# Patient Record
Sex: Female | Born: 1950 | Race: White | Hispanic: No | Marital: Married | Smoking: Never smoker
Health system: Southern US, Community
[De-identification: ages and names within clinical notes are randomized; demographics above are authoritative.]

## PROBLEM LIST (undated history)

## (undated) HISTORY — PX: TUBAL LIGATION: SHX77

## (undated) HISTORY — PX: CHOLECYSTECTOMY: SHX55

## (undated) HISTORY — PX: KNEE ARTHROSCOPY: SUR90

---

## 2013-11-15 ENCOUNTER — Emergency Department (HOSPITAL_COMMUNITY)
Admission: EM | Admit: 2013-11-15 | Discharge: 2013-11-15 | Disposition: A | Discharge status: Home / Self Care | Payer: BC Managed Care – PPO | Attending: Emergency Medicine | Admitting: Emergency Medicine

## 2013-11-15 ENCOUNTER — Emergency Department (HOSPITAL_COMMUNITY): Payer: BC Managed Care – PPO

## 2013-11-15 ENCOUNTER — Encounter (HOSPITAL_COMMUNITY): Payer: Self-pay | Admitting: Emergency Medicine

## 2013-11-15 DIAGNOSIS — R109 Unspecified abdominal pain: Secondary | ICD-10-CM

## 2013-11-15 DIAGNOSIS — R1032 Left lower quadrant pain: Secondary | ICD-10-CM | POA: Insufficient documentation

## 2013-11-15 DIAGNOSIS — Z9089 Acquired absence of other organs: Secondary | ICD-10-CM | POA: Insufficient documentation

## 2013-11-15 DIAGNOSIS — N950 Postmenopausal bleeding: Secondary | ICD-10-CM | POA: Insufficient documentation

## 2013-11-15 DIAGNOSIS — Z9851 Tubal ligation status: Secondary | ICD-10-CM | POA: Insufficient documentation

## 2013-11-15 DIAGNOSIS — G8929 Other chronic pain: Secondary | ICD-10-CM | POA: Insufficient documentation

## 2013-11-15 DIAGNOSIS — Z3202 Encounter for pregnancy test, result negative: Secondary | ICD-10-CM | POA: Insufficient documentation

## 2013-11-15 DIAGNOSIS — Z79899 Other long term (current) drug therapy: Secondary | ICD-10-CM | POA: Insufficient documentation

## 2013-11-15 LAB — URINALYSIS, ROUTINE W REFLEX MICROSCOPIC
Bilirubin Urine: NEGATIVE
Glucose, UA: NEGATIVE mg/dL
Hgb urine dipstick: NEGATIVE
Ketones, ur: NEGATIVE mg/dL
LEUKOCYTES UA: NEGATIVE
Nitrite: NEGATIVE
Protein, ur: NEGATIVE mg/dL
Specific Gravity, Urine: 1.004 — ABNORMAL LOW (ref 1.005–1.030)
UROBILINOGEN UA: 0.2 mg/dL (ref 0.0–1.0)
pH: 7 (ref 5.0–8.0)

## 2013-11-15 LAB — BASIC METABOLIC PANEL
BUN: 7 mg/dL (ref 6–23)
CHLORIDE: 100 meq/L (ref 96–112)
CO2: 24 meq/L (ref 19–32)
Calcium: 9.6 mg/dL (ref 8.4–10.5)
Creatinine, Ser: 0.59 mg/dL (ref 0.50–1.10)
GFR calc Af Amer: >90 mL/min (ref >90)
GFR calc non Af Amer: >90 mL/min (ref >90)
Glucose, Bld: 114 mg/dL — ABNORMAL HIGH (ref 70–99)
POTASSIUM: 3.6 meq/L — AB (ref 3.7–5.3)
SODIUM: 139 meq/L (ref 137–147)

## 2013-11-15 LAB — POCT PREGNANCY, URINE: Preg Test, Ur: NEGATIVE

## 2013-11-15 LAB — CBC WITH DIFFERENTIAL/PLATELET
BASOS ABS: 0 10*3/uL (ref 0.0–0.1)
Basophils Relative: 0 % (ref 0–1)
Eosinophils Absolute: 0 10*3/uL (ref 0.0–0.7)
Eosinophils Relative: 0 % (ref 0–5)
HCT: 38.4 % (ref 36.0–46.0)
Hemoglobin: 13.4 g/dL (ref 12.0–15.0)
LYMPHS PCT: 15 % (ref 12–46)
Lymphs Abs: 1.3 10*3/uL (ref 0.7–4.0)
MCH: 29.8 pg (ref 26.0–34.0)
MCHC: 34.9 g/dL (ref 30.0–36.0)
MCV: 85.5 fL (ref 78.0–100.0)
Monocytes Absolute: 0.4 10*3/uL (ref 0.1–1.0)
Monocytes Relative: 4 % (ref 3–12)
NEUTROS ABS: 7.3 10*3/uL (ref 1.7–7.7)
Neutrophils Relative %: 81 % — ABNORMAL HIGH (ref 43–77)
PLATELETS: 272 10*3/uL (ref 150–400)
RBC: 4.49 MIL/uL (ref 3.87–5.11)
RDW: 12.3 % (ref 11.5–15.5)
WBC: 9 10*3/uL (ref 4.0–10.5)

## 2013-11-15 LAB — WET PREP, GENITAL
Clue Cells Wet Prep HPF POC: NONE SEEN
Trich, Wet Prep: NONE SEEN
Yeast Wet Prep HPF POC: NONE SEEN

## 2013-11-15 MED ORDER — MORPHINE SULFATE 4 MG/ML IJ SOLN
4.0000 mg | Freq: Once | INTRAMUSCULAR | Status: AC
  Administered 2013-11-15: 4 mg via INTRAVENOUS
  Filled 2013-11-15: qty 1

## 2013-11-15 MED ORDER — MORPHINE SULFATE 4 MG/ML IJ SOLN
INTRAMUSCULAR | Status: AC
  Filled 2013-11-15: qty 1

## 2013-11-15 MED ORDER — IOHEXOL 300 MG/ML  SOLN
50.0000 mL | Freq: Once | INTRAMUSCULAR | Status: AC | PRN
  Administered 2013-11-15: 50 mL via ORAL

## 2013-11-15 MED ORDER — IOHEXOL 300 MG/ML  SOLN
100.0000 mL | Freq: Once | INTRAMUSCULAR | Status: AC | PRN
  Administered 2013-11-15: 100 mL via INTRAVENOUS

## 2013-11-15 MED ORDER — OXYCODONE HCL 5 MG PO TABS: 2.5000 mg | ORAL_TABLET | ORAL | Status: DC | PRN

## 2013-11-15 MED ORDER — ONDANSETRON HCL 4 MG/2ML IJ SOLN
4.0000 mg | Freq: Once | INTRAMUSCULAR | Status: DC
  Filled 2013-11-15: qty 2

## 2013-11-15 NOTE — ED Notes (Signed)
Pt bent over yesterday and had severe burning and stabbing pain to lower abd; got nauseated that passed after a few mins; got better; last night pain returned with vaginal bleeding-bright red; pink mucous tinged discharge since then; pain severe lower abd--increased left side radiating to groin/back/lower pelvis; pt states 20 years ago had tubal sterilization and has had left sided pain since then; pain now feels twisting and grabbing

## 2013-11-15 NOTE — ED Notes (Signed)
Patient transported to CT 

## 2013-11-15 NOTE — ED Provider Notes (Signed)
CSN: [id number]     Arrival date & time 11/15/13  0750 History   First MD Initiated Contact with Patient 11/15/13 [id number]     Chief Complaint  Patient presents with  . Vaginal Bleeding   (Consider location/radiation/quality/duration/timing/severity/associated sxs/prior Treatment) HPI Is a 63 year old female presents emergency department with chief complaint of abdominal pain and vaginal bleeding.  The patient states that yesterday she reached down to pick something off the ground when she said she had "searing left-sided pelvic pain."  She states that it lasted for several minutes.  The patient states that since then she has constant pain in the left lower cautery.  Is now radiating to the left flank.  She states it is intermittent and at times severe.  The patient noticed last night after urinating per red blood after wiping.  She states that it was vaginal and not from her urine.  The patient says that progressed into a bloody and white creamy discharge.  Patient denies any urinary symptoms.  She has been postmenopausal and has had no period for the past 12 years.  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  Patient states that this is the worst pain she's ever had.  She denies history of kidney stones. Denies fevers, chills, myalgias, arthralgias. Denies DOE, SOB, chest tightness or pressure, radiation to left arm, jaw or back, or diaphoresis. Denies headaches, light headedness, weakness, visual disturbances. Denies  nausea, vomiting, diarrhea or constipation.    History reviewed. No pertinent past medical history. Past Surgical History  Procedure Laterality Date  . Tubal ligation    . Knee arthroscopy    . Cholecystectomy     No family history on file. History  Substance Use Topics  . Smoking status: Never Smoker   . Smokeless tobacco: Not on file  . Alcohol Use: No   OB History   Grav Para Term Preterm Abortions TAB SAB Ect Mult Living              Review of Systems Ten systems reviewed and are negative for acute change, except as noted in the HPI.   Allergies  Acetaminophen and Macrodantin  Home Medications   Current Outpatient Rx  Name  Route  Sig  Dispense  Refill  . cholecalciferol (VITAMIN D) 1000 UNITS tablet   Oral   Take 1,000 Units by mouth daily.         . Flaxseed, Linseed, (FLAX SEED OIL PO)   Oral   Take 1,400 mg by mouth daily.         . Misc Natural Products (PYCNOGENOL COMPLEX PO)   Oral   Take 100 mg by mouth daily.         Marland Kitchen omega-3 acid ethyl esters (LOVAZA) 1 G capsule   Oral   Take 1 g by mouth daily.         . vitamin E 1000 UNIT capsule   Oral   Take 1,000 Units by mouth daily.         Marland Kitchen zinc gluconate 50 MG tablet   Oral   Take 50 mg by mouth daily.          BP 157/84  Pulse 73  Temp(Src) 97.7 F (36.5 C) (Oral)  Resp 16  SpO2 97% Physical Exam  Constitutional: She is oriented to person, place, and time. She appears well-developed and well-nourished. No distress.  HENT:  Head: Normocephalic and atraumatic.  Eyes: Conjunctivae are  normal. No scleral icterus.  Neck: Normal range of motion.  Cardiovascular: Normal rate, regular rhythm and normal heart sounds.  Exam reveals no gallop and no friction rub.   No murmur heard. Pulmonary/Chest: Effort normal and breath sounds normal. No respiratory distress.  Abdominal: Soft. Bowel sounds are normal. She exhibits no distension and no mass. There is tenderness. There is no guarding.  Exquisite tenderness LLQ. No CVA tenderness  Neurological: She is alert and oriented to person, place, and time.  Skin: Skin is warm and dry. She is not diaphoretic.  Pelvic exam: VULVA: normal appearing vulva with no masses, tenderness or lesions, VAGINA: normal appearing vagina with normal color and discharge, no lesions, CERVIX: scant blood and cervix from ,CMT + no UTERUS: uterus is normal size, shape, consistency and nontender,  ADNEXA: tenderness left.   ED Course  Procedures (including critical care time) Labs Review Labs Reviewed  WET PREP, GENITAL - Abnormal; Notable for the following:    WBC, Wet Prep HPF POC FEW (*)    All other components within normal limits  URINALYSIS, ROUTINE W REFLEX MICROSCOPIC - Abnormal; Notable for the following:    Specific Gravity, Urine 1.004 (*)    All other components within normal limits  CBC WITH DIFFERENTIAL - Abnormal; Notable for the following:    Neutrophils Relative % 81 (*)    All other components within normal limits  BASIC METABOLIC PANEL - Abnormal; Notable for the following:    Potassium 3.6 (*)    Glucose, Bld 114 (*)    All other components within normal limits  GC/CHLAMYDIA PROBE AMP  POCT PREGNANCY, URINE   Imaging Review No results found.  EKG Interpretation   None       MDM   1. Abdominal pain   2. Postmenopausal vaginal bleeding    Patient with LLQ, vaginal bleeding US to evaluate   11:39 AM Patient lab unremarkable. US negative. Continues to have pain. Will obtain ct abdomen    2:13 PM Patient with negative work up.  Will have her follow up with her ob gyn.  Arthor Captainbigail Rue Valladares, PA-C 11/15/13 1454

## 2013-11-15 NOTE — Discharge Instructions (Signed)
Abdominal (belly) pain can be caused by many things. Your caregiver performed an examination and possibly ordered blood/urine tests and imaging (CT scan, x-rays, ultrasound). Many cases can be observed and treated at home after initial evaluation in the emergency department. Even though you are being discharged home, abdominal pain can be unpredictable. Therefore, you need a repeated exam if your pain does not resolve, returns, or worsens. Most patients with abdominal pain don't have to be admitted to the hospital or have surgery, but serious problems like appendicitis and gallbladder attacks can start out as nonspecific pain. Many abdominal conditions cannot be diagnosed in one visit, so follow-up evaluations are very important. SEEK IMMEDIATE MEDICAL ATTENTION IF: The pain does not go away or becomes severe.  A temperature above 101 develops.  Repeated vomiting occurs (multiple episodes).  The pain becomes localized to portions of the abdomen. The right side could possibly be appendicitis. In an adult, the left lower portion of the abdomen could be colitis or diverticulitis.  Blood is being passed in stools or vomit (bright red or black tarry stools).  Return also if you develop chest pain, difficulty breathing, dizziness or fainting, or become confused, poorly responsive, or inconsolable (young children).  Do not drive, operate heavy machinery, drink alcohol, or take other tylenol containing products with this medicine.   Abdominal Pain, Women Abdominal (stomach, pelvic, or belly) pain can be caused by many things. It is important to tell your doctor:  The location of the pain.  Does it come and go or is it present all the time?  Are there things that start the pain (eating certain foods, exercise)?  Are there other symptoms associated with the pain (fever, nausea, vomiting, diarrhea)? All of this is helpful to know when trying to find the cause of the pain. CAUSES   Stomach: virus or  bacteria infection, or ulcer.  Intestine: appendicitis (inflamed appendix), regional ileitis (Crohn's disease), ulcerative colitis (inflamed colon), irritable bowel syndrome, diverticulitis (inflamed diverticulum of the colon), or cancer of the stomach or intestine.  Gallbladder disease or stones in the gallbladder.  Kidney disease, kidney stones, or infection.  Pancreas infection or cancer.  Fibromyalgia (pain disorder).  Diseases of the female organs:  Uterus: fibroid (non-cancerous) tumors or infection.  Fallopian tubes: infection or tubal pregnancy.  Ovary: cysts or tumors.  Pelvic adhesions (scar tissue).  Endometriosis (uterus lining tissue growing in the pelvis and on the pelvic organs).  Pelvic congestion syndrome (female organs filling up with blood just before the menstrual period).  Pain with the menstrual period.  Pain with ovulation (producing an egg).  Pain with an IUD (intrauterine device, birth control) in the uterus.  Cancer of the female organs.  Functional pain (pain not caused by a disease, may improve without treatment).  Psychological pain.  Depression. DIAGNOSIS  Your doctor will decide the seriousness of your pain by doing an examination.  Blood tests.  X-rays.  Ultrasound.  CT scan (computed tomography, special type of X-ray).  MRI (magnetic resonance imaging).  Cultures, for infection.  Barium enema (dye inserted in the large intestine, to better view it with X-rays).  Colonoscopy (looking in intestine with a lighted tube).  Laparoscopy (minor surgery, looking in abdomen with a lighted tube).  Major abdominal exploratory surgery (looking in abdomen with a large incision). TREATMENT  The treatment will depend on the cause of the pain.   Many cases can be observed and treated at home.  Over-the-counter medicines recommended by your caregiver.  Prescription medicine.  Antibiotics, for infection.  Birth control pills, for  painful periods or for ovulation pain.  Hormone treatment, for endometriosis.  Nerve blocking injections.  Physical therapy.  Antidepressants.  Counseling with a psychologist or psychiatrist.  Minor or major surgery. HOME CARE INSTRUCTIONS   Do not take laxatives, unless directed by your caregiver.  Take over-the-counter pain medicine only if ordered by your caregiver. Do not take aspirin because it can cause an upset stomach or bleeding.  Try a clear liquid diet (broth or water) as ordered by your caregiver. Slowly move to a bland diet, as tolerated, if the pain is related to the stomach or intestine.  Have a thermometer and take your temperature several times a day, and record it.  Bed rest and sleep, if it helps the pain.  Avoid sexual intercourse, if it causes pain.  Avoid stressful situations.  Keep your follow-up appointments and tests, as your caregiver orders.  If the pain does not go away with medicine or surgery, you may try:  Acupuncture.  Relaxation exercises (yoga, meditation).  Group therapy.  Counseling. SEEK MEDICAL CARE IF:   You notice certain foods cause stomach pain.  Your home care treatment is not helping your pain.  You need stronger pain medicine.  You want your IUD removed.  You feel faint or lightheaded.  You develop nausea and vomiting.  You develop a rash.  You are having side effects or an allergy to your medicine. SEEK IMMEDIATE MEDICAL CARE IF:   Your pain does not go away or gets worse.  You have a fever.  Your pain is felt only in portions of the abdomen. The right side could possibly be appendicitis. The left lower portion of the abdomen could be colitis or diverticulitis.  You are passing blood in your stools (bright red or black tarry stools, with or without vomiting).  You have blood in your urine.  You develop chills, with or without a fever.  You pass out. MAKE SURE YOU:   Understand these  instructions.  Will watch your condition.  Will get help right away if you are not doing well or get worse. Document Released: 08/10/2007 Document Revised: 01/05/2012 Document Reviewed: 08/30/2009 [facility] Patient Information 2014 [place], [place].  Abnormal Uterine Bleeding Abnormal uterine bleeding means bleeding from the vagina that is not your normal menstrual period. This can be:  Bleeding or spotting between periods.  Bleeding after sex (sexual intercourse).  Bleeding that is heavier or more than normal.  Periods that last longer than usual.  Bleeding after menopause. There are many problems that may cause this. Treatment will depend on the cause of the bleeding. Any kind of bleeding that is not normal should be reviewed by your doctor.  HOME CARE Watch your condition for any changes. These actions may lessen any discomfort you are having:  Do not use tampons or douches as told by your doctor.  Change your pads often. You should get regular pelvic exams and Pap tests. Keep all appointments for tests as told by your doctor. GET HELP IF:  You are bleeding for more than 1 week.  You feel dizzy at times. GET HELP RIGHT AWAY IF:   You pass out.  You have to change pads every 15 to 30 minutes.  You have belly pain.  You have a fever.  You become sweaty or weak.  You are passing large blood clots from the vagina.  You feel sick to your stomach (nauseous) and throw  up (vomit). MAKE SURE YOU:  Understand these instructions.  Will watch your condition.  Will get help right away if you are not doing well or get worse. Document Released: 08/10/2009 Document Revised: 08/03/2013 Document Reviewed: 05/12/2013 [facility] Patient Information 2014 [place], [place].

## 2013-11-16 LAB — GC/CHLAMYDIA PROBE AMP
CT PROBE, AMP APTIMA: NEGATIVE
GC Probe RNA: NEGATIVE

## 2013-11-17 NOTE — ED Provider Notes (Signed)
Medical screening examination/treatment/procedure(s) were performed by non-physician practitioner and as supervising physician I was immediately available for consultation/collaboration.  EKG Interpretation   None         Richardean Canalavid H Neftaly Swiss, MD 11/17/13 [phone]

## 2018-07-20 DIAGNOSIS — E039 Hypothyroidism, unspecified: Secondary | ICD-10-CM | POA: Diagnosis present

## 2018-09-07 ENCOUNTER — Emergency Department (HOSPITAL_COMMUNITY): Payer: Medicare Other

## 2018-09-07 ENCOUNTER — Other Ambulatory Visit: Payer: Self-pay

## 2018-09-07 ENCOUNTER — Encounter (HOSPITAL_COMMUNITY): Payer: Self-pay | Admitting: Emergency Medicine

## 2018-09-07 ENCOUNTER — Emergency Department (HOSPITAL_COMMUNITY)
Admission: EM | Admit: 2018-09-07 | Discharge: 2018-09-07 | Disposition: A | Discharge status: Home / Self Care | Payer: Medicare Other | Attending: Emergency Medicine | Admitting: Emergency Medicine

## 2018-09-07 DIAGNOSIS — Z91040 Latex allergy status: Secondary | ICD-10-CM | POA: Insufficient documentation

## 2018-09-07 DIAGNOSIS — R1032 Left lower quadrant pain: Secondary | ICD-10-CM

## 2018-09-07 DIAGNOSIS — E860 Dehydration: Secondary | ICD-10-CM | POA: Diagnosis not present

## 2018-09-07 DIAGNOSIS — R3989 Other symptoms and signs involving the genitourinary system: Secondary | ICD-10-CM

## 2018-09-07 DIAGNOSIS — Z79899 Other long term (current) drug therapy: Secondary | ICD-10-CM | POA: Insufficient documentation

## 2018-09-07 LAB — COMPREHENSIVE METABOLIC PANEL
ALK PHOS: 89 U/L (ref 38–126)
ALT: 22 U/L (ref 0–44)
AST: 28 U/L (ref 15–41)
Albumin: 4.8 g/dL (ref 3.5–5.0)
Anion gap: 14 (ref 5–15)
BILIRUBIN TOTAL: 1.2 mg/dL (ref 0.3–1.2)
BUN: 8 mg/dL (ref 8–23)
CO2: 18 mmol/L — AB (ref 22–32)
Calcium: 9.8 mg/dL (ref 8.9–10.3)
Chloride: 103 mmol/L (ref 98–111)
Creatinine, Ser: 0.72 mg/dL (ref 0.44–1.00)
GFR calc Af Amer: >60 mL/min (ref >60)
GFR calc non Af Amer: >60 mL/min (ref >60)
Glucose, Bld: 120 mg/dL — ABNORMAL HIGH (ref 70–99)
Potassium: 3.3 mmol/L — ABNORMAL LOW (ref 3.5–5.1)
Sodium: 135 mmol/L (ref 135–145)
Total Protein: 8.2 g/dL — ABNORMAL HIGH (ref 6.5–8.1)

## 2018-09-07 LAB — URINALYSIS, ROUTINE W REFLEX MICROSCOPIC
BILIRUBIN URINE: NEGATIVE
Glucose, UA: NEGATIVE mg/dL
Hgb urine dipstick: NEGATIVE
Ketones, ur: 20 mg/dL — AB
Leukocytes, UA: NEGATIVE
NITRITE: NEGATIVE
Protein, ur: NEGATIVE mg/dL
SPECIFIC GRAVITY, URINE: 1.004 — AB (ref 1.005–1.030)
pH: 7 (ref 5.0–8.0)

## 2018-09-07 LAB — I-STAT CG4 LACTIC ACID, ED: LACTIC ACID, VENOUS: 2.05 mmol/L — AB (ref 0.5–1.9)

## 2018-09-07 LAB — CBC
HCT: 41.6 % (ref 36.0–46.0)
Hemoglobin: 14.4 g/dL (ref 12.0–15.0)
MCH: 30.1 pg (ref 26.0–34.0)
MCHC: 34.6 g/dL (ref 30.0–36.0)
MCV: 87 fL (ref 80.0–100.0)
Platelets: 281 10*3/uL (ref 150–400)
RBC: 4.78 MIL/uL (ref 3.87–5.11)
RDW: 12.1 % (ref 11.5–15.5)
WBC: 7.8 10*3/uL (ref 4.0–10.5)
nRBC: 0 % (ref 0.0–0.2)

## 2018-09-07 LAB — LIPASE, BLOOD: Lipase: 31 U/L (ref 11–51)

## 2018-09-07 MED ORDER — OXYCODONE HCL 5 MG PO TABS: 5.0000 mg | ORAL_TABLET | ORAL | 0 refills | Status: DC | PRN

## 2018-09-07 MED ORDER — SODIUM CHLORIDE 0.9 % IV BOLUS
1000.0000 mL | Freq: Once | INTRAVENOUS | Status: AC
  Administered 2018-09-07: 1000 mL via INTRAVENOUS

## 2018-09-07 MED ORDER — MORPHINE SULFATE (PF) 4 MG/ML IV SOLN
4.0000 mg | Freq: Once | INTRAVENOUS | Status: AC
  Administered 2018-09-07: 4 mg via INTRAVENOUS
  Filled 2018-09-07: qty 1

## 2018-09-07 MED ORDER — CEFPODOXIME PROXETIL 200 MG PO TABS: 200.0000 mg | ORAL_TABLET | Freq: Two times a day (BID) | ORAL | 0 refills | Status: AC

## 2018-09-07 MED ORDER — METRONIDAZOLE 500 MG PO TABS: 500.0000 mg | ORAL_TABLET | Freq: Three times a day (TID) | ORAL | 0 refills | Status: AC

## 2018-09-07 MED ORDER — VANCOMYCIN HCL IN DEXTROSE 1-5 GM/200ML-% IV SOLN
1000.0000 mg | Freq: Once | INTRAVENOUS | Status: AC
  Administered 2018-09-07: 1000 mg via INTRAVENOUS
  Filled 2018-09-07: qty 200

## 2018-09-07 MED ORDER — MORPHINE SULFATE (PF) 4 MG/ML IV SOLN
6.0000 mg | Freq: Once | INTRAVENOUS | Status: AC
  Administered 2018-09-07: 6 mg via INTRAVENOUS
  Filled 2018-09-07: qty 2

## 2018-09-07 MED ORDER — KETOROLAC TROMETHAMINE 15 MG/ML IJ SOLN
15.0000 mg | Freq: Once | INTRAMUSCULAR | Status: AC
  Administered 2018-09-07: 15 mg via INTRAVENOUS
  Filled 2018-09-07: qty 1

## 2018-09-07 MED ORDER — PIPERACILLIN-TAZOBACTAM 3.375 G IVPB 30 MIN
3.3750 g | Freq: Once | INTRAVENOUS | Status: AC
  Administered 2018-09-07: 3.375 g via INTRAVENOUS
  Filled 2018-09-07: qty 50

## 2018-09-07 MED ORDER — ONDANSETRON HCL 4 MG/2ML IJ SOLN
4.0000 mg | Freq: Once | INTRAMUSCULAR | Status: AC | PRN
  Administered 2018-09-07: 4 mg via INTRAVENOUS
  Filled 2018-09-07: qty 2

## 2018-09-07 MED ORDER — POTASSIUM CHLORIDE CRYS ER 20 MEQ PO TBCR: 20.0000 meq | EXTENDED_RELEASE_TABLET | Freq: Two times a day (BID) | ORAL | 0 refills | Status: DC

## 2018-09-07 MED ORDER — IOHEXOL 300 MG/ML  SOLN
30.0000 mL | Freq: Once | INTRAMUSCULAR | Status: AC | PRN
  Administered 2018-09-07: 30 mL via ORAL

## 2018-09-07 MED ORDER — ONDANSETRON 4 MG PO TBDP: 4.0000 mg | ORAL_TABLET | Freq: Three times a day (TID) | ORAL | 0 refills | Status: DC | PRN

## 2018-09-07 NOTE — Discharge Instructions (Addendum)
As we discussed, I'd recommend seeing Urology as above.  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 Make sure your primary doctor knows and I'd recommend being seen again this week for check-up

## 2018-09-07 NOTE — Progress Notes (Signed)
A consult was received from an ED physician for vancomycin per pharmacy dosing.  The patient's profile has been reviewed for ht/wt/allergies/indication/available labs.   A one time order has been placed for vancomycin 1000 mg, labs pending.  Further antibiotics/pharmacy consults should be ordered by admitting physician if indicated.                       Thank you, Valentina GuChristy, Devan Danzer D 09/07/2018  7:19 AM

## 2018-09-07 NOTE — ED Notes (Signed)
Patient transported to X-ray 

## 2018-09-07 NOTE — ED Provider Notes (Signed)
[facility]-EMERGENCY DEPT Provider Note   CSN: [id number] Arrival date & time: 09/07/18  0542     History   Chief Complaint Chief Complaint  Patient presents with  . Abdominal Pain    HPI Christy Evans is a 67 y.o. female.  HPI 67 year old female here with left-sided abdominal pain.  Since the end of October, the patient states she is had persistent abdominal pain.  It was initially suprapubic and has now become severe, throughout the suprapubic and left side of her abdomen.  She has been seen multiple times by her PCP in urgent care for this.  She had a urine culture done, which grew Morganella and enterococcus.  She was placed on Ceftin ear.  She had a CT stone study that showed air in the bladder but was otherwise read as unremarkable.  She is also had a renal ultrasound.  She states that since then, she is had persistent, worsening, left flank pain.  The pain is now severe, 10 out of 10, and worse with any kind of palpation or movement.  No alleviating factors.  She said associated nausea but no vomiting.  She also endorses severe pain with bowel movements as well as passage of occasional clear to yellow fluid per rectum.  History reviewed. No pertinent past medical history.  There are no active problems to display for this patient.   Past Surgical History:  Procedure Laterality Date  . CHOLECYSTECTOMY    . KNEE ARTHROSCOPY    . TUBAL LIGATION       OB History   None      Home Medications    Prior to Admission medications   Medication Sig Start Date End Date Taking? Authorizing Provider  Acetyl Hexapeptide-8 POWD Take 500 mg by mouth daily.   Yes [provider]  acidophilus (RISAQUAD) CAPS capsule Take by mouth daily.   Yes [provider]  B Complex Vitamins (VITAMIN B COMPLEX PO) Take 1 tablet by mouth daily.   Yes [provider]  cholecalciferol (VITAMIN D) 1000 UNITS tablet Take 1,000 Units by mouth daily.   Yes  [provider]  Coenzyme Q10 (CO Q-10) 200 MG CAPS Take 200 mg by mouth daily.   Yes [provider]  DOCUSATE SODIUM PO Take 1 capsule by mouth daily.   Yes [provider]  EVENING PRIMROSE OIL PO Take by mouth.   Yes [provider]  levothyroxine (SYNTHROID, LEVOTHROID) 25 MCG tablet Take 25 mcg by mouth daily. 07/20/18 10/18/18 Yes [provider]  Misc Natural Products (PYCNOGENOL COMPLEX PO) Take 100 mg by mouth daily.   Yes [provider]  OVER THE COUNTER MEDICATION Hpf cholestate.   Yes [provider]  vitamin E 1000 UNIT capsule Take 1,000 Units by mouth daily.   Yes [provider]  zinc gluconate 50 MG tablet Take 50 mg by mouth daily.   Yes [provider]  cefdinir (OMNICEF) 300 MG capsule Take 300 mg by mouth daily. 08/23/18   [provider]  cefpodoxime (VANTIN) 200 MG tablet Take 1 tablet (200 mg total) by mouth 2 (two) times daily for 7 days. 09/07/18 09/14/18  Shaune Pollack, MD  fluconazole (DIFLUCAN) 150 MG tablet Take 150 mg by mouth 2 (two) times daily. For 7 days 08/29/18   [provider]  metroNIDAZOLE (FLAGYL) 500 MG tablet Take 1 tablet (500 mg total) by mouth 3 (three) times daily for 7 days. 09/07/18 09/14/18  Shaune Pollack,  MD  ondansetron (ZOFRAN ODT) 4 MG disintegrating tablet Take 1 tablet (4 mg total) by mouth every 8 (eight) hours as needed for nausea or vomiting. 09/07/18   Shaune Pollack, MD  oxyCODONE (ROXICODONE) 5 MG immediate release tablet Take 1 tablet (5 mg total) by mouth every 4 (four) hours as needed for moderate pain or severe pain. 09/07/18   Shaune Pollack, MD  potassium chloride SA (K-DUR,KLOR-CON) 20 MEQ tablet Take 1 tablet (20 mEq total) by mouth 2 (two) times daily for 3 days. 09/07/18 09/10/18  Shaune Pollack, MD    Family History No family history on file.  Social History Social History   Tobacco Use  . Smoking status: Never  Smoker  Substance Use Topics  . Alcohol use: No  . Drug use: Not on file     Allergies   Acetaminophen; Flu virus vaccine; Latex; Nitrofurantoin macrocrystal; and Tape   Review of Systems Review of Systems  Constitutional: Positive for chills and fatigue. Negative for fever.  HENT: Negative for congestion and rhinorrhea.   Eyes: Negative for visual disturbance.  Respiratory: Negative for cough, shortness of breath and wheezing.   Cardiovascular: Negative for chest pain and leg swelling.  Gastrointestinal: Positive for abdominal pain and nausea. Negative for diarrhea and vomiting.  Genitourinary: Positive for dysuria and frequency. Negative for flank pain.  Musculoskeletal: Negative for neck pain and neck stiffness.  Skin: Negative for rash and wound.  Allergic/Immunologic: Negative for immunocompromised state.  Neurological: Positive for weakness. Negative for syncope and headaches.  All other systems reviewed and are negative.    Physical Exam Updated Vital Signs BP 115/69   Pulse 71   Temp (!) 97.4 F (36.3 C) (Oral)   Resp 20   SpO2 100%   Physical Exam  Constitutional: She is oriented to person, place, and time. She appears well-developed and well-nourished. She appears ill. She appears distressed.  HENT:  Head: Normocephalic and atraumatic.  Eyes: Conjunctivae are normal.  Neck: Neck supple.  Cardiovascular: Normal rate, regular rhythm and normal heart sounds. Exam reveals no friction rub.  No murmur heard. Pulmonary/Chest: Effort normal and breath sounds normal. No respiratory distress. She has no wheezes. She has no rales.  Abdominal: Soft. Normal appearance. She exhibits no distension. There is generalized tenderness and tenderness in the suprapubic area, left upper quadrant and left lower quadrant. There is rebound and guarding. There is no rigidity.  Musculoskeletal: She exhibits no edema.  Neurological: She is alert and oriented to person, place, and time.  She exhibits normal muscle tone.  Skin: Skin is warm. Capillary refill takes less than 2 seconds.  Psychiatric: She has a normal mood and affect.  Nursing note and vitals reviewed.    ED Treatments / Results  Labs (all labs ordered are listed, but only abnormal results are displayed) Labs Reviewed  COMPREHENSIVE METABOLIC PANEL - Abnormal; Notable for the following components:      Result Value   Potassium 3.3 (*)    CO2 18 (*)    Glucose, Bld 120 (*)    Total Protein 8.2 (*)    All other components within normal limits  URINALYSIS, ROUTINE W REFLEX MICROSCOPIC - Abnormal; Notable for the following components:   Color, Urine STRAW (*)    Specific Gravity, Urine 1.004 (*)    Ketones, ur 20 (*)    All other components within normal limits  I-STAT CG4 LACTIC ACID, ED - Abnormal; Notable for the following components:   Lactic Acid, Venous  2.05 (*)    All other components within normal limits  CULTURE, BLOOD (ROUTINE X 2)  CULTURE, BLOOD (ROUTINE X 2)  URINE CULTURE  LIPASE, BLOOD  CBC  I-STAT CG4 LACTIC ACID, ED    EKG None  Radiology Ct Abdomen Pelvis Wo Contrast  Result Date: 09/07/2018 CLINICAL DATA:  Concern for colovesical fistula. Pelvic pain. Left flank pain. EXAM: CT ABDOMEN AND PELVIS WITHOUT CONTRAST TECHNIQUE: Multidetector CT imaging of the abdomen and pelvis was performed following the standard protocol without IV contrast. COMPARISON:  09/01/2018 FINDINGS: Lower chest: Lung bases are clear. No effusions. Heart is normal size. Hepatobiliary: No focal liver abnormality is seen. Status post cholecystectomy. No biliary dilatation. Pancreas: No focal abnormality or ductal dilatation. Spleen: No focal abnormality.  Normal size. Adrenals/Urinary Tract: No adrenal abnormality. No focal renal abnormality. No stones or hydronephrosis. Urinary bladder is unremarkable. No gas or contrast noted within the bladder on today's study. Stomach/Bowel: Rectal contrast has been  administered. No visible colovesical fistula. Moderate stool in the colon. Scattered sigmoid diverticula. No active diverticulitis. Stomach and small bowel decompressed, unremarkable. Vascular/Lymphatic: No evidence of aneurysm or adenopathy. Reproductive: Uterus and adnexa unremarkable.  No mass. Other: No free fluid or free air. Musculoskeletal: No acute bony abnormality. IMPRESSION: No gas or contrast noted within the urinary bladder. No visible colovesical fistula. Moderate stool burden in the colon.  Scattered sigmoid diverticula. Prior cholecystectomy. Electronically Signed   By: Charlett Nose M.D.   On: 09/07/2018 08:51   Dg Abdomen Acute W/chest  Result Date: 09/07/2018 CLINICAL DATA:  Severe abdominal pain. EXAM: DG ABDOMEN ACUTE W/ 1V CHEST COMPARISON:  CT 09/01/2018. FINDINGS: Mediastinum and hilar structures normal. Lungs are clear. No pleural effusion or pneumothorax. Heart size normal. Thoracic spine scoliosis and degenerative change. Surgical clips right upper quadrant. Soft tissues the abdomen unremarkable. No bowel distention or free air. Pelvic calcifications most consistent phleboliths. Lumbar spine scoliosis and degenerative change. IMPRESSION: 1.  No acute cardiopulmonary disease. 2.  No acute intra-abdominal abnormality. Electronically Signed   By: Maisie Fus  Register   On: 09/07/2018 08:11    Procedures Procedures (including critical care time)  Medications Ordered in ED Medications  ondansetron [facility]) injection 4 mg (has no administration in time range)  sodium chloride 0.9 % bolus 1,000 mL (0 mLs Intravenous Stopped 09/07/18 0852)  piperacillin-tazobactam (ZOSYN) IVPB 3.375 g (0 g Intravenous Stopped 09/07/18 0831)  morphine 4 MG/ML injection 6 mg (6 mg Intravenous Given 09/07/18 0758)  vancomycin (VANCOCIN) IVPB 1000 mg/200 mL premix (0 mg Intravenous Stopped 09/07/18 1012)  iohexol (OMNIPAQUE) 300 MG/ML solution 30 mL (30 mLs Oral Contrast Given 09/07/18 0814)  morphine 4  MG/ML injection 4 mg (4 mg Intravenous Given 09/07/18 0855)  sodium chloride 0.9 % bolus 1,000 mL (0 mLs Intravenous Stopped 09/07/18 1012)  ketorolac (TORADOL) 15 MG/ML injection 15 mg (15 mg Intravenous Given 09/07/18 0940)     Initial Impression / Assessment and Plan / ED Course  I have reviewed the triage vital signs and the nursing notes.  Pertinent labs & imaging results that were available during my care of the patient were reviewed by me and considered in my medical decision making (see chart for details).     67 yo F here with pneumaturia, L flank pain. 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 Pt had UCx + enterococcus, morganella consistent with this - has been on Cefdinir. Will check CT w/ rectal contrast, start fluids and analgesia as well as empiric  ABX basedon tachycardia, LA elevation. D/w Radiology.  Labs, imaging are reassuring. LA minimally elevated likely 2/2 dehydration, but WBC normal, no fever, no signs of sepsis. CMP and UA are c/w dehydration. UA negative, though pt has been on ABX. CT w/ rectal contrast is negative for fistula. D/w General Surgery and Urology on call, Dr. Marlou PorchHerrick. Will refer for Urology outpatient follow-up, and given her pain and sx will add flagyl and extend her coverage for an additional week. We discussed my concern for fistula and pt understands. Encouraged fluids, pain meds at home, and good return precautions.  Final Clinical Impressions(s) / ED Diagnoses   Final diagnoses:  Pneumaturia  Dehydration  LLQ pain    ED Discharge Orders         Ordered    ondansetron (ZOFRAN ODT) 4 MG disintegrating tablet  Every 8 hours PRN     09/07/18 1003    cefpodoxime (VANTIN) 200 MG tablet  2 times daily     09/07/18 1003    metroNIDAZOLE (FLAGYL) 500 MG tablet  3 times daily     09/07/18 1003    oxyCODONE (ROXICODONE) 5 MG immediate release tablet  Every 4 hours PRN     09/07/18 1003    potassium chloride SA (K-DUR,KLOR-CON) 20 MEQ  tablet  2 times daily     09/07/18 1005           Shaune PollackIsaacs, Starnisha Batrez, MD 09/07/18 1014

## 2018-09-07 NOTE — ED Notes (Signed)
Bed: ZO10 Expected date:  Expected time:  Means of arrival:  Comments: [place]

## 2018-09-07 NOTE — ED Notes (Signed)
Dr. Erma HeritageIsaacs notified of patient's lactic acid result.

## 2018-09-07 NOTE — ED Notes (Signed)
Patient given discharge teaching and verbalized understanding. Patient was taken out of ED with a wheelchair. Patient was driven home by family member.  

## 2018-09-07 NOTE — ED Triage Notes (Signed)
Pt arriving POV with abdominal pain. Pt was seen by primary and placed on cefdinir. Pt reports she has been having bladder spasms and increased gas. Pt states the pain is in her left flank area and extends to her back and into her lower abdominal area. Pt also having increased pressure in her rectum. Pt having bowel movements but is concerned with consistency.

## 2018-09-09 ENCOUNTER — Emergency Department (HOSPITAL_COMMUNITY): Payer: Medicare Other

## 2018-09-09 ENCOUNTER — Encounter (HOSPITAL_COMMUNITY): Payer: Self-pay | Admitting: Emergency Medicine

## 2018-09-09 ENCOUNTER — Emergency Department (HOSPITAL_COMMUNITY)
Admission: EM | Admit: 2018-09-09 | Discharge: 2018-09-09 | Disposition: A | Discharge status: Home / Self Care | Payer: Medicare Other | Attending: Emergency Medicine | Admitting: Emergency Medicine

## 2018-09-09 DIAGNOSIS — M545 Low back pain: Secondary | ICD-10-CM | POA: Diagnosis not present

## 2018-09-09 DIAGNOSIS — R110 Nausea: Secondary | ICD-10-CM | POA: Insufficient documentation

## 2018-09-09 DIAGNOSIS — R1030 Lower abdominal pain, unspecified: Secondary | ICD-10-CM | POA: Insufficient documentation

## 2018-09-09 DIAGNOSIS — R630 Anorexia: Secondary | ICD-10-CM | POA: Diagnosis not present

## 2018-09-09 DIAGNOSIS — R10815 Periumbilic abdominal tenderness: Secondary | ICD-10-CM | POA: Diagnosis not present

## 2018-09-09 DIAGNOSIS — K5900 Constipation, unspecified: Secondary | ICD-10-CM | POA: Diagnosis not present

## 2018-09-09 DIAGNOSIS — R10814 Left lower quadrant abdominal tenderness: Secondary | ICD-10-CM | POA: Diagnosis not present

## 2018-09-09 DIAGNOSIS — R6883 Chills (without fever): Secondary | ICD-10-CM | POA: Diagnosis not present

## 2018-09-09 LAB — CBC WITH DIFFERENTIAL/PLATELET
ABS IMMATURE GRANULOCYTES: 0.02 10*3/uL (ref 0.00–0.07)
Basophils Absolute: 0 10*3/uL (ref 0.0–0.1)
Basophils Relative: 1 %
EOS PCT: 1 %
Eosinophils Absolute: 0 10*3/uL (ref 0.0–0.5)
HEMATOCRIT: 39.2 % (ref 36.0–46.0)
HEMOGLOBIN: 13 g/dL (ref 12.0–15.0)
Immature Granulocytes: 0 %
LYMPHS ABS: 1.3 10*3/uL (ref 0.7–4.0)
LYMPHS PCT: 21 %
MCH: 29.5 pg (ref 26.0–34.0)
MCHC: 33.2 g/dL (ref 30.0–36.0)
MCV: 89.1 fL (ref 80.0–100.0)
MONO ABS: 0.4 10*3/uL (ref 0.1–1.0)
MONOS PCT: 6 %
NEUTROS ABS: 4.7 10*3/uL (ref 1.7–7.7)
Neutrophils Relative %: 71 %
Platelets: 245 10*3/uL (ref 150–400)
RBC: 4.4 MIL/uL (ref 3.87–5.11)
RDW: 12.3 % (ref 11.5–15.5)
WBC: 6.5 10*3/uL (ref 4.0–10.5)
nRBC: 0 % (ref 0.0–0.2)

## 2018-09-09 LAB — LIPASE, BLOOD: LIPASE: 26 U/L (ref 11–51)

## 2018-09-09 LAB — COMPREHENSIVE METABOLIC PANEL
ALK PHOS: 80 U/L (ref 38–126)
ALT: 31 U/L (ref 0–44)
ANION GAP: 10 (ref 5–15)
AST: 35 U/L (ref 15–41)
Albumin: 4.5 g/dL (ref 3.5–5.0)
BILIRUBIN TOTAL: 0.9 mg/dL (ref 0.3–1.2)
BUN: 5 mg/dL — ABNORMAL LOW (ref 8–23)
CALCIUM: 9.4 mg/dL (ref 8.9–10.3)
CO2: 24 mmol/L (ref 22–32)
CREATININE: 0.7 mg/dL (ref 0.44–1.00)
Chloride: 106 mmol/L (ref 98–111)
GFR calc non Af Amer: >60 mL/min (ref >60)
Glucose, Bld: 112 mg/dL — ABNORMAL HIGH (ref 70–99)
Potassium: 3.9 mmol/L (ref 3.5–5.1)
Sodium: 140 mmol/L (ref 135–145)
TOTAL PROTEIN: 7.3 g/dL (ref 6.5–8.1)

## 2018-09-09 LAB — I-STAT CG4 LACTIC ACID, ED
Lactic Acid, Venous: 1.18 mmol/L (ref 0.5–1.9)
Lactic Acid, Venous: 0.81 mmol/L (ref 0.5–1.9)

## 2018-09-09 LAB — URINALYSIS, ROUTINE W REFLEX MICROSCOPIC
BILIRUBIN URINE: NEGATIVE
GLUCOSE, UA: NEGATIVE mg/dL
Hgb urine dipstick: NEGATIVE
Ketones, ur: 5 mg/dL — AB
Leukocytes, UA: NEGATIVE
Nitrite: NEGATIVE
PROTEIN: NEGATIVE mg/dL
SPECIFIC GRAVITY, URINE: 1.005 (ref 1.005–1.030)
pH: 7 (ref 5.0–8.0)

## 2018-09-09 MED ORDER — MORPHINE SULFATE (PF) 4 MG/ML IV SOLN
4.0000 mg | Freq: Once | INTRAVENOUS | Status: AC
  Administered 2018-09-09: 4 mg via INTRAVENOUS
  Filled 2018-09-09: qty 1

## 2018-09-09 MED ORDER — ONDANSETRON HCL 4 MG/2ML IJ SOLN
4.0000 mg | Freq: Once | INTRAMUSCULAR | Status: AC
  Administered 2018-09-09: 4 mg via INTRAVENOUS
  Filled 2018-09-09: qty 2

## 2018-09-09 MED ORDER — SODIUM CHLORIDE 0.9 % IV BOLUS
1000.0000 mL | Freq: Once | INTRAVENOUS | Status: AC
  Administered 2018-09-09: 1000 mL via INTRAVENOUS

## 2018-09-09 MED ORDER — IOPAMIDOL (ISOVUE-300) INJECTION 61%
100.0000 mL | Freq: Once | INTRAVENOUS | Status: AC | PRN
  Administered 2018-09-09: 100 mL via INTRAVENOUS

## 2018-09-09 MED ORDER — IOHEXOL 300 MG/ML  SOLN: 25.0000 mL | Freq: Once | INTRAMUSCULAR | Status: DC | PRN

## 2018-09-09 MED ORDER — IOPAMIDOL (ISOVUE-300) INJECTION 61%
INTRAVENOUS | Status: AC
  Filled 2018-09-09: qty 100

## 2018-09-09 MED ORDER — OXYCODONE HCL 5 MG PO TABS: 5.0000 mg | ORAL_TABLET | ORAL | 0 refills | Status: DC | PRN

## 2018-09-09 MED ORDER — IOPAMIDOL (ISOVUE-370) INJECTION 76%
INTRAVENOUS | Status: AC
  Filled 2018-09-09: qty 100

## 2018-09-09 NOTE — ED Triage Notes (Signed)
Pt repotrs that she was seen here on Tuesday and was diagnosed with bladder fistula and diverticula. Pt reports that she was given medications that arent helping. The rash is getting better but pains and pelvic pressure is not relieving,

## 2018-09-09 NOTE — ED Notes (Signed)
Patient transported to CT 

## 2018-09-09 NOTE — Discharge Instructions (Signed)
Your work-up today was overall reassuring.  I suspect you are slightly dehydrated contributing to the constipation.  We obtained ultrasounds and CT imaging which did not show new abnormality within the abdomen that would require surgery or inpatient management.  As you report your urinalysis was negative last time you were diagnosed with the UTI, please continue your antibiotics and use the new pain medicine to help with your symptoms.  Please maintain hydration and use the MiraLAX to help with the constipation.  Please follow-up with your urologist and a new primary doctor.  If any symptoms change or worsen, please return to the nearest emergency department.

## 2018-09-09 NOTE — ED Provider Notes (Signed)
[facility]-EMERGENCY DEPT Provider Note   CSN: [id number] Arrival date & time: 09/09/18  0734     History   Chief Complaint Chief Complaint  Patient presents with  . Abdominal Pain    HPI Christy Evans is a 67 y.o. female.  The history is provided by the patient, a relative and medical records. No language interpreter was used.  Abdominal Pain   This is a recurrent problem. The current episode started more than 2 days ago. The problem occurs constantly. The problem has been rapidly worsening. The pain is associated with an unknown factor. The pain is located in the RLQ, LLQ and suprapubic region. The quality of the pain is sharp and aching. The pain is at a severity of 10/10. The pain is severe. Associated symptoms include anorexia, nausea, constipation and frequency. Pertinent negatives include fever, diarrhea, vomiting, dysuria and headaches. The symptoms are aggravated by palpation and eating. Nothing relieves the symptoms.    History reviewed. No pertinent past medical history.  There are no active problems to display for this patient.   Past Surgical History:  Procedure Laterality Date  . CHOLECYSTECTOMY    . KNEE ARTHROSCOPY    . TUBAL LIGATION       OB History   None      Home Medications    Prior to Admission medications   Medication Sig Start Date End Date Taking? Authorizing Provider  Acetyl Hexapeptide-8 POWD Take 500 mg by mouth daily.    [provider]  acidophilus (RISAQUAD) CAPS capsule Take by mouth daily.    [provider]  B Complex Vitamins (VITAMIN B COMPLEX PO) Take 1 tablet by mouth daily.    [provider]  cefdinir (OMNICEF) 300 MG capsule Take 300 mg by mouth daily. 08/23/18   [provider]  cefpodoxime (VANTIN) 200 MG tablet Take 1 tablet (200 mg total) by mouth 2 (two) times daily for 7 days. 09/07/18 09/14/18  Shaune Pollack, MD  cholecalciferol (VITAMIN D) 1000 UNITS tablet  Take 1,000 Units by mouth daily.    [provider]  Coenzyme Q10 (CO Q-10) 200 MG CAPS Take 200 mg by mouth daily.    [provider]  DOCUSATE SODIUM PO Take 1 capsule by mouth daily.    [provider]  EVENING PRIMROSE OIL PO Take by mouth.    [provider]  fluconazole (DIFLUCAN) 150 MG tablet Take 150 mg by mouth 2 (two) times daily. For 7 days 08/29/18   [provider]  levothyroxine (SYNTHROID, LEVOTHROID) 25 MCG tablet Take 25 mcg by mouth daily. 07/20/18 10/18/18  [provider]  metroNIDAZOLE (FLAGYL) 500 MG tablet Take 1 tablet (500 mg total) by mouth 3 (three) times daily for 7 days. 09/07/18 09/14/18  Shaune Pollack, MD  Misc Natural Products (PYCNOGENOL COMPLEX PO) Take 100 mg by mouth daily.    [provider]  ondansetron (ZOFRAN ODT) 4 MG disintegrating tablet Take 1 tablet (4 mg total) by mouth every 8 (eight) hours as needed for nausea or vomiting. 09/07/18   Shaune Pollack, MD  OVER THE COUNTER MEDICATION Hpf cholestate.    [provider]  oxyCODONE (ROXICODONE) 5 MG immediate release tablet Take 1 tablet (5 mg total) by mouth every 4 (four) hours as needed for moderate pain or severe pain. 09/07/18   Shaune Pollack, MD  potassium chloride SA (K-DUR,KLOR-CON) 20 MEQ tablet Take 1 tablet (20 mEq total) by mouth 2 (two) times daily for  3 days. 09/07/18 09/10/18  Shaune Pollack, MD  vitamin E 1000 UNIT capsule Take 1,000 Units by mouth daily.    [provider]  zinc gluconate 50 MG tablet Take 50 mg by mouth daily.    [provider]    Family History No family history on file.  Social History Social History   Tobacco Use  . Smoking status: Never Smoker  . Smokeless tobacco: Never Used  Substance Use Topics  . Alcohol use: No  . Drug use: Not on file     Allergies   Acetaminophen; Flu virus vaccine; Latex; Nitrofurantoin macrocrystal; and Tape   Review of  Systems Review of Systems  Constitutional: Positive for chills. Negative for diaphoresis, fatigue and fever.  HENT: Negative for congestion.   Eyes: Negative for visual disturbance.  Respiratory: Negative for cough, chest tightness, shortness of breath and wheezing.   Cardiovascular: Negative for chest pain and palpitations.  Gastrointestinal: Positive for abdominal pain, anorexia, constipation and nausea. Negative for abdominal distention, diarrhea and vomiting.  Genitourinary: Positive for frequency. Negative for dysuria.  Musculoskeletal: Negative for back pain, neck pain and neck stiffness.  Skin: Negative for rash and wound.  Neurological: Negative for light-headedness and headaches.  All other systems reviewed and are negative.    Physical Exam Updated Vital Signs BP (!) 143/102 (BP Location: Left Arm)   Pulse 84   Temp 98.2 F (36.8 C) (Oral)   Resp 20   SpO2 95%   Physical Exam  Constitutional: She appears well-developed and well-nourished.  Non-toxic appearance. She does not appear ill. She appears distressed.  HENT:  Head: Normocephalic and atraumatic.  Mouth/Throat: Oropharynx is clear and moist.  Eyes: Pupils are equal, round, and reactive to light. Conjunctivae and EOM are normal.  Neck: Neck supple.  Cardiovascular: Normal rate and regular rhythm.  No murmur heard. Pulmonary/Chest: Effort normal and breath sounds normal. No respiratory distress.  Abdominal: Soft. Normal appearance. There is tenderness in the right lower quadrant, suprapubic area and left lower quadrant. There is no rigidity and no CVA tenderness.  Musculoskeletal: She exhibits no edema.       Lumbar back: She exhibits tenderness.       Back:  Neurological: She is alert.  Skin: Skin is warm and dry. Capillary refill takes less than 2 seconds. She is not diaphoretic. No erythema. No pallor.  Psychiatric: She has a normal mood and affect.  Nursing note and vitals reviewed.    ED Treatments /  Results  Labs (all labs ordered are listed, but only abnormal results are displayed) Labs Reviewed  COMPREHENSIVE METABOLIC PANEL - Abnormal; Notable for the following components:      Result Value   Glucose, Bld 112 (*)    BUN 5 (*)    All other components within normal limits  URINALYSIS, ROUTINE W REFLEX MICROSCOPIC - Abnormal; Notable for the following components:   Color, Urine STRAW (*)    Ketones, ur 5 (*)    All other components within normal limits  URINE CULTURE  CBC WITH DIFFERENTIAL/PLATELET  LIPASE, BLOOD  I-STAT CG4 LACTIC ACID, ED  I-STAT CG4 LACTIC ACID, ED    EKG None  Radiology US Transvaginal Non-ob  Result Date: 09/09/2018 CLINICAL DATA:  Bilateral pelvic pain. Prior tubal ligation. Gravida 0. EXAM: TRANSABDOMINAL AND TRANSVAGINAL ULTRASOUND OF PELVIS TECHNIQUE: Both transabdominal and transvaginal ultrasound examinations of the pelvis were performed. Transabdominal technique was performed for global imaging of the pelvis including uterus, ovaries, adnexal regions,  and pelvic cul-de-sac. It was necessary to proceed with endovaginal exam following the transabdominal exam to visualize the uterus and endometrium, ovaries. COMPARISON:  CT of the abdomen and pelvis 09/09/2018 FINDINGS: Uterus Measurements: 4.2 x 1.9 x 3.4 centimeters = volume: 14.56 mL. No fibroids or other mass visualized. Endometrium Thickness: 1.5 millimeters. Small amount of fluid is identified in the endometrial canal. Right ovary Measurements: The ovary is not visualized, either absent or obscured. No adnexal mass. Left ovary Measurements: The ovary is not visualized, either absent or obscured. No adnexal mass. Pulsed Doppler evaluation of both ovaries demonstrates normal low-resistance arterial and venous waveforms. Other findings No abnormal free fluid. Additional Doppler evaluation was not performed due to nonvisualization of the ovaries. IMPRESSION: 1. Anteflexed normal appearing uterus. 2. Thin  endometrial stripe. 3. The ovaries are not visualized.  No adnexal mass identified. Electronically Signed   By: Norva Pavlov M.D.   On: 09/09/2018 13:40   US Pelvis Complete  Result Date: 09/09/2018 CLINICAL DATA:  Bilateral pelvic pain. Prior tubal ligation. Gravida 0. EXAM: TRANSABDOMINAL AND TRANSVAGINAL ULTRASOUND OF PELVIS TECHNIQUE: Both transabdominal and transvaginal ultrasound examinations of the pelvis were performed. Transabdominal technique was performed for global imaging of the pelvis including uterus, ovaries, adnexal regions, and pelvic cul-de-sac. It was necessary to proceed with endovaginal exam following the transabdominal exam to visualize the uterus and endometrium, ovaries. COMPARISON:  CT of the abdomen and pelvis 09/09/2018 FINDINGS: Uterus Measurements: 4.2 x 1.9 x 3.4 centimeters = volume: 14.56 mL. No fibroids or other mass visualized. Endometrium Thickness: 1.5 millimeters. Small amount of fluid is identified in the endometrial canal. Right ovary Measurements: The ovary is not visualized, either absent or obscured. No adnexal mass. Left ovary Measurements: The ovary is not visualized, either absent or obscured. No adnexal mass. Pulsed Doppler evaluation of both ovaries demonstrates normal low-resistance arterial and venous waveforms. Other findings No abnormal free fluid. Additional Doppler evaluation was not performed due to nonvisualization of the ovaries. IMPRESSION: 1. Anteflexed normal appearing uterus. 2. Thin endometrial stripe. 3. The ovaries are not visualized.  No adnexal mass identified. Electronically Signed   By: Norva Pavlov M.D.   On: 09/09/2018 13:40   Ct Abdomen Pelvis W Contrast  Result Date: 09/09/2018 CLINICAL DATA:  Worsening lower abdominal pain. EXAM: CT ABDOMEN AND PELVIS WITH CONTRAST TECHNIQUE: Multidetector CT imaging of the abdomen and pelvis was performed using the standard protocol following bolus administration of intravenous contrast.  CONTRAST:  ISOVUE-300 IOPAMIDOL (ISOVUE-300) INJECTION 61% COMPARISON:  09/07/2018 FINDINGS: Lower chest: Lung bases are clear. No effusions. Heart is normal size. Hepatobiliary: No focal liver abnormality is seen. Status post cholecystectomy. No biliary dilatation. Pancreas: No focal abnormality or ductal dilatation. Spleen: No focal abnormality.  Normal size. Adrenals/Urinary Tract: No adrenal abnormality. No focal renal abnormality. No stones or hydronephrosis. Urinary bladder is unremarkable. Stomach/Bowel: Sigmoid diverticulosis. No active diverticulitis. Moderate stool burden throughout the colon. Appendix is normal. Stomach and small bowel decompressed, unremarkable. Vascular/Lymphatic: No evidence of aneurysm or adenopathy. Reproductive: Uterus and adnexa unremarkable.  No mass. Other: No free fluid or free air. Musculoskeletal: No acute bony abnormality. IMPRESSION: Moderate stool burden in the colon.  Normal appendix. Sigmoid diverticulosis.  No active diverticulitis. No acute findings in the abdomen or pelvis. Electronically Signed   By: Charlett Nose M.D.   On: 09/09/2018 11:27    Procedures Procedures (including critical care time)  Medications Ordered in ED Medications  iopamidol (ISOVUE-370) 76 % injection (has no administration  in time range)  iopamidol (ISOVUE-300) 61 % injection (has no administration in time range)  iohexol (OMNIPAQUE) 300 MG/ML solution 25 mL (has no administration in time range)  sodium chloride 0.9 % bolus 1,000 mL (0 mLs Intravenous Stopped 09/09/18 1525)  morphine 4 MG/ML injection 4 mg (4 mg Intravenous Given 09/09/18 0908)  ondansetron (ZOFRAN) injection 4 mg (4 mg Intravenous Given 09/09/18 0906)  iopamidol (ISOVUE-300) 61 % injection 100 mL (100 mLs Intravenous Contrast Given 09/09/18 1109)  morphine 4 MG/ML injection 4 mg (4 mg Intravenous Given 09/09/18 1227)  ondansetron (ZOFRAN) injection 4 mg (4 mg Intravenous Given 09/09/18 1226)     Initial  Impression / Assessment and Plan / ED Course  I have reviewed the triage vital signs and the nursing notes.  Pertinent labs & imaging results that were available during my care of the patient were reviewed by me and considered in my medical decision making (see chart for details).     Christy Evans is a 67 y.o. female with a past medical history significant for prior cholecystectomy and prior tubal ligation who presents with severe abdominal pain, nausea, frequency, and bubbles in urine.  Patient reports that she was seen 2 days ago and was discharged with a diagnosis of urinary tract infection.  Patient was started on Flagyl and Cefpodoxime after it was discovered that prior culture showed enterococcus and Morganella.  Patient also had CT imaging last week showing some air in the bladder and this was concerning for a fistula.  Patient had CT 2 days ago which did not show evidence of fistula, perforation, or abscess.  Patient had her pain controlled and was able to go home however she reports that her last 2 days she has not had a bowel movement, has not eating or drinking, is having continued nausea but no vomiting.  She is having continued urinary frequency and some air in her urine.  She says that her abdominal pain has drastically worsened all across the lower abdomen and going towards her back.  She is previously was in the left lower quadrant and suprapubic area but now it is also in the right lower quadrant.  She is writhing around in pain and describes it as up to a 10 out of 10 in severity.  She says it is now an 8 out of 10 when she is sitting still and keeping her legs bent and laying flat.  She is unable to twist or bend without severe pain.  She denies any URI symptoms, chest pain, or shortness of breath.  On exam, abdomen is exquisitely tender on the entire lower abdomen.  Patient has some tenderness in the low back as well.  Legs not edematous and nontender.  Patient keeping her knees bent due  to pressure on her abdomen.  Lungs were clear and chest was nontender.    Chart review shows that patient had work-up to look for a fistula however they did not see last time.  I am concerned today based on the patient's acute worsening of pain for either worsened cystitis, possible fistula/diverticulitis/abscess/perforation.  Patient will have repeat CT imaging given her ill appearance.  She also have repeat laboratory testing and urinalysis.  Patient will have pain medicine, nausea medicine, and fluids given to help rehydrate patient and help with discomfort.  Given patient's intolerance of p.o. and severe discomfort, anticipate even if work-up is completely reassuring, she will likely require admission for uncontrolled pain in the setting of at least a  severe atypical cystitis.       12:11 PM Patient was reassessed and continued to have severe abdominal pain when the pain medicine worn off.  Patient is still having nausea and vomiting.  She will be given more pain and nausea medicine.  Patient's work-up began to return which was overall reassuring.  Patient's CT scan showed no evidence of fistula, abscess, obstruction, appendicitis, diverticulitis, or other significant abnormality.  Patient says that she thinks that she may have had an ovarian cause of pain in the past after she had her tubes tied.  Due to this revelation and her continued severe abdominal pain without significant findings on CT, patient will have ultrasound to assess for torsion.  Patient says that her urinalysis that grew the Morganella and enterococcus recently was initially negative on UA and was found on culture.  Although her urinalysis is reassuring here, clinically suspect patient is still having symptoms of cystitis and UTI.  After ultrasound, patient continued to have severe pain that is not controlled is still having nausea and vomiting and is not tolerating p.o., I will likely to admit her for symptoms associated with  occult UTI.  Ultrasound showed no evidence of torsion.  No masses seen.  Ovaries were not discretely seen however.  Next  Patient feeling much better after pain medicine.  Next  I suspect the patient has continued irritation in her bladder from the recent UTI which she is currently treated with antibiotics.  Patient instructed to continue the antibiotic.  Patient will be given prescription for pain medication and will use MiraLAX for the constipation we discovered.  Patient agreed with plan of care and follow-up instructions with both PCP and urology.  Next  Patient and family had no other questions or concerns and patient was discharged after reassuring work-up.  Final Clinical Impressions(s) / ED Diagnoses   Final diagnoses:  Lower abdominal pain  Nausea  Constipation, unspecified constipation type    ED Discharge Orders         Ordered    oxyCODONE (ROXICODONE) 5 MG immediate release tablet  Every 4 hours PRN     09/09/18 1436         Clinical Impression: 1. Lower abdominal pain   2. Nausea   3. Constipation, unspecified constipation type     Disposition: Discharge  Condition: Good  I have discussed the results, Dx and Tx plan with the pt(& family if present). He/she/they expressed understanding and agree(s) with the plan. Discharge instructions discussed at great length. Strict return precautions discussed and pt &/or family have verbalized understanding of the instructions. No further questions at time of discharge.    Discharge Medication List as of 09/09/2018  2:38 PM    START taking these medications   Details  !! oxyCODONE (ROXICODONE) 5 MG immediate release tablet Take 1 tablet (5 mg total) by mouth every 4 (four) hours as needed for severe pain., Starting Thu 09/09/2018, Print     !! - Potential duplicate medications found. Please discuss with provider.      Follow Up: [facility] [address] [id number] mc [address] [place] [address] [phone]    Your new PCP and Urologist        , Canary Brim, MD 09/09/18 [phone]

## 2018-09-10 LAB — URINE CULTURE
CULTURE: NO GROWTH
Culture: 30000 — AB

## 2018-09-11 ENCOUNTER — Telehealth: Payer: Self-pay

## 2018-09-11 NOTE — Telephone Encounter (Signed)
Post ED Visit - Positive Culture Follow-up: Successful Patient Follow-Up  Culture assessed and recommendations reviewed by:  []  Enzo BiNathan Batchelder, Pharm.D. []  Celedonio MiyamotoJeremy Frens, Pharm.D., BCPS AQ-ID [x]  Garvin FilaMike Maccia, Pharm.D., BCPS []  Georgina PillionElizabeth Martin, Pharm.D., BCPS []  Greers FerryMinh Pham, VermontPharm.D., BCPS, AAHIVP []  Estella HuskMichelle Turner, Pharm.D., BCPS, AAHIVP []  Lysle Pearlachel Rumbarger, PharmD, BCPS []  Phillips Climeshuy Dang, PharmD, BCPS []  Agapito GamesAlison Masters, PharmD, BCPS []  Verlan FriendsErin Deja, PharmD  Positive urine culture  []  Patient discharged without antimicrobial prescription and treatment is now indicated [x]  Organism is resistant to prescribed ED discharge antimicrobial []  Patient with positive blood cultures  Changes discussed with ED provider: Dietrich PatesHina Khatri [facility] New antibiotic prescription Levofloxacin 250 mg QD x 5 days [facility] [place] [phone]  Contacted patient, date 09/11/18, time 1011   Keely Drennan, Linnell FullingRose Burnett 09/11/2018, 10:09 AM

## 2018-09-11 NOTE — Progress Notes (Signed)
ED Antimicrobial Stewardship Positive Culture Follow Up   Christy Evans is an 67 y.o. female who presented to [facility] on 09/09/2018 with a chief complaint of  Chief Complaint  Patient presents with  . Abdominal Pain    Recent Results (from the past 720 hour(s))  Urine culture     Status: Abnormal   Collection Time: 09/07/18  6:28 AM  Result Value Ref Range Status   Specimen Description   Final    URINE, CLEAN CATCH Performed at [facility], [address] [place], [place] [address]    Special Requests   Final    NONE Performed at [facility], [address] [place], [place] [address]    Culture (A)  Final    30,000 COLONIES/mL PSEUDOMONAS AERUGINOSA 30,000 COLONIES/mL ENTEROCOCCUS FAECALIS    Report Status 09/10/2018 FINAL  Final   Organism ID, Bacteria PSEUDOMONAS AERUGINOSA (A)  Final   Organism ID, Bacteria ENTEROCOCCUS FAECALIS (A)  Final      Susceptibility   Enterococcus faecalis - MIC*    AMPICILLIN <=2 SENSITIVE Sensitive     LEVOFLOXACIN 2 SENSITIVE Sensitive     NITROFURANTOIN <=16 SENSITIVE Sensitive     VANCOMYCIN 1 SENSITIVE Sensitive     * 30,000 COLONIES/mL ENTEROCOCCUS FAECALIS   Pseudomonas aeruginosa - MIC*    CEFTAZIDIME 4 SENSITIVE Sensitive     CIPROFLOXACIN <=0.25 SENSITIVE Sensitive     GENTAMICIN <=1 SENSITIVE Sensitive     IMIPENEM 1 SENSITIVE Sensitive     PIP/TAZO 8 SENSITIVE Sensitive     CEFEPIME 2 SENSITIVE Sensitive     * 30,000 COLONIES/mL PSEUDOMONAS AERUGINOSA  Blood culture (routine x 2)     Status: None (Preliminary result)   Collection Time: 09/07/18  7:34 AM  Result Value Ref Range Status   Specimen Description   Final    BLOOD RIGHT ANTECUBITAL Performed at [facility], [address] [place], [place] [address]    Special Requests   Final    BOTTLES DRAWN AEROBIC AND ANAEROBIC Blood Culture adequate volume Performed at [facility], [address] [place], [place] [address]    Culture   Final    NO GROWTH 4 DAYS Performed at [facility], [address], [place], [place] [address]    Report Status PENDING  Incomplete  Blood culture (routine x 2)     Status: None (Preliminary result)   Collection Time: 09/07/18  7:37 AM  Result Value Ref Range Status   Specimen Description   Final    BLOOD LEFT WRIST Performed at [facility], [address] [place], [place] [address]    Special Requests   Final    BOTTLES DRAWN AEROBIC AND ANAEROBIC Blood Culture adequate volume Performed at [facility], [address] [place], [place] [address]    Culture   Final    NO GROWTH 4 DAYS Performed at [facility], [address], [place], [place] [address]    Report Status PENDING  Incomplete  Urine culture     Status: None   Collection Time: 09/09/18  9:23 AM  Result Value Ref Range Status   Specimen Description   Final    URINE, CLEAN CATCH Performed at [facility], [address] [place], [place] [address]    Special Requests   Final    NONE Performed at [facility], [address]  [place], [place] [address]    Culture   Final    NO GROWTH Performed at [facility], [address] [place], [place] [address]    Report Status 09/10/2018 FINAL  Final    New antibiotic prescription: DC all current abx; start levofloxacin 250 mg qd x 5 days  ED Provider: Dietrich PatesHina Khatri, PA-C  Bertram MillardMichael A Jenna Routzahn 09/11/2018, 9:17 AM Clinical Pharmacist Monday - Friday phone -  [phone] Saturday - Sunday phone - [phone]

## 2018-09-14 LAB — CULTURE, BLOOD (ROUTINE X 2)
Culture: NO GROWTH
Culture: NO GROWTH
SPECIAL REQUESTS: ADEQUATE
Special Requests: ADEQUATE

## 2019-07-30 IMAGING — CT CT ABD-PELV W/ CM
3 of 5 series · 17 of 46 positions shown, 19 images · IV contrast (ISOVUE)
Comparison: 09/07/2018

CLINICAL DATA: Worsening lower abdominal pain.

EXAM:
CT ABDOMEN AND PELVIS WITH CONTRAST
TECHNIQUE: Multidetector CT imaging of the abdomen and pelvis was performed
using the standard protocol following bolus administration of
intravenous contrast.
CONTRAST:  100mL IZ5QD3-MPP IOPAMIDOL (IZ5QD3-MPP) INJECTION 61%

[Series 2: axial st · axial · 0.75mm/px · z∈[-395,-45]mm · 11 of 85 slices shown, 13 images]
[im 8/85  soft-tissue]
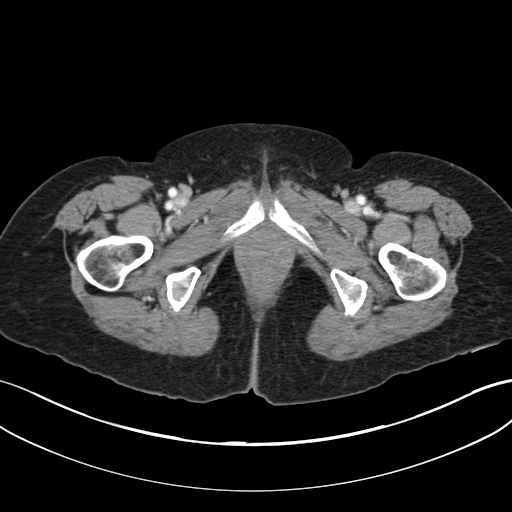
[im 8/85  bone]
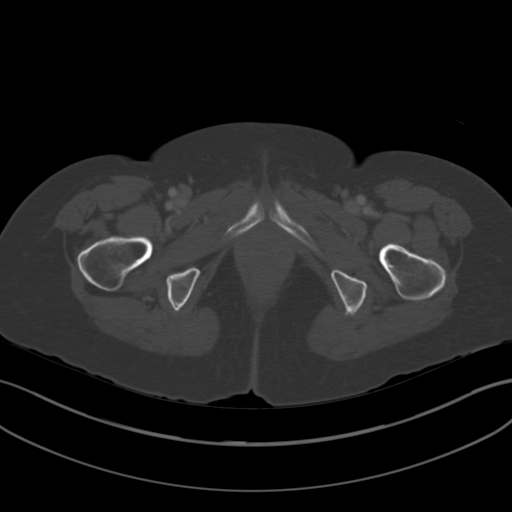
[im 15/85  soft-tissue]
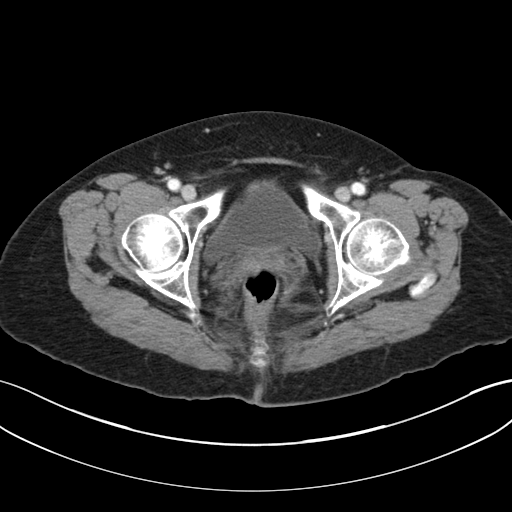
[im 22/85  soft-tissue]
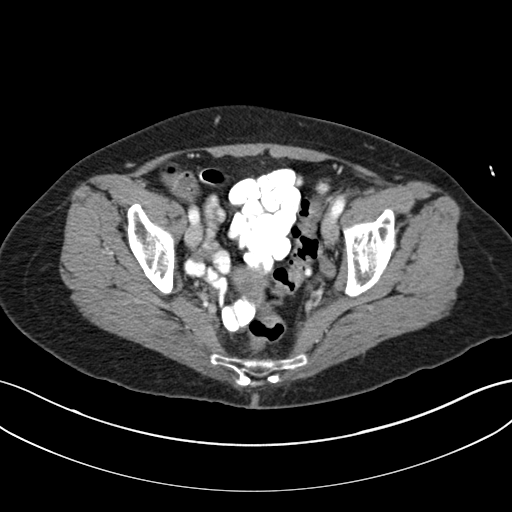
[im 29/85  soft-tissue]
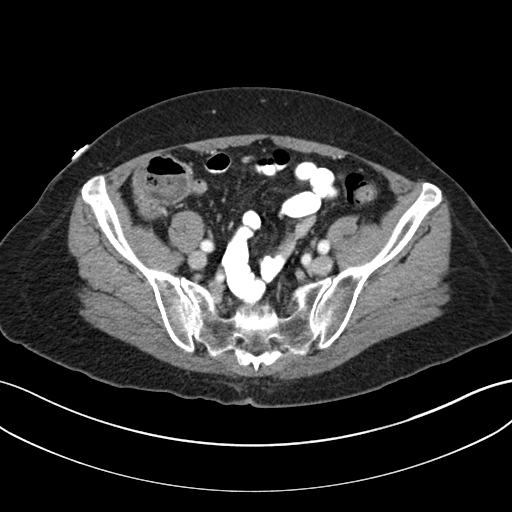
[im 36/85  soft-tissue]
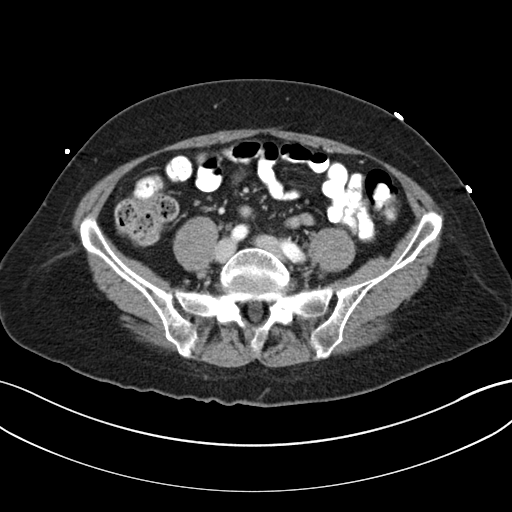
[im 43/85  soft-tissue]
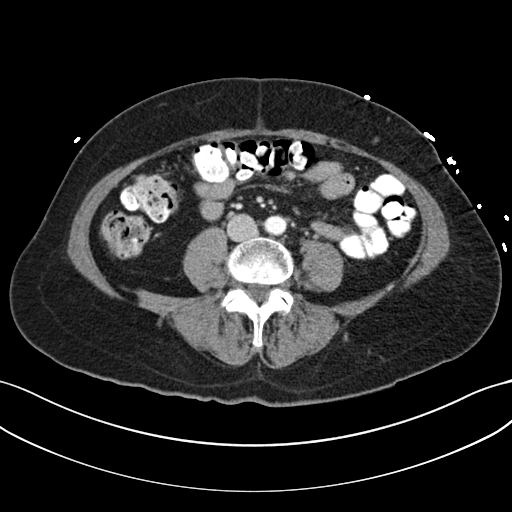
[im 50/85  soft-tissue]
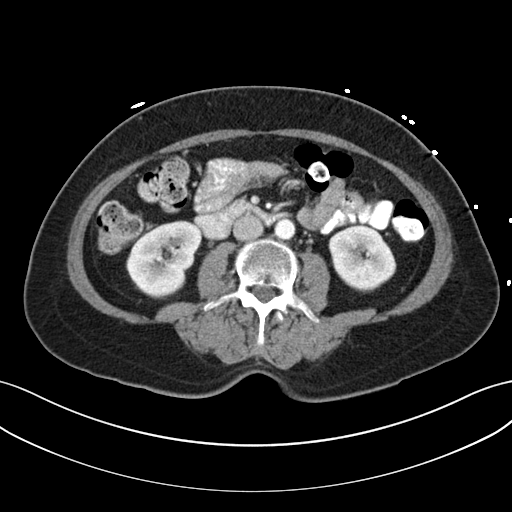
[im 57/85  soft-tissue]
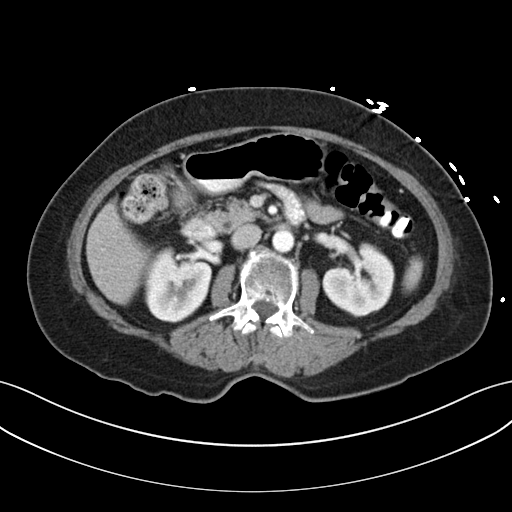
[im 64/85  soft-tissue]
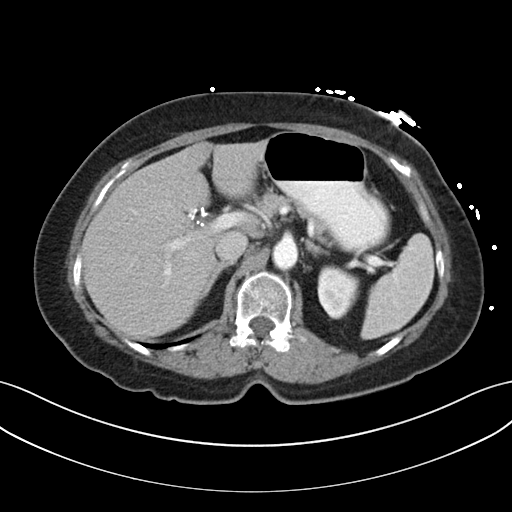
[im 64/85  bone]
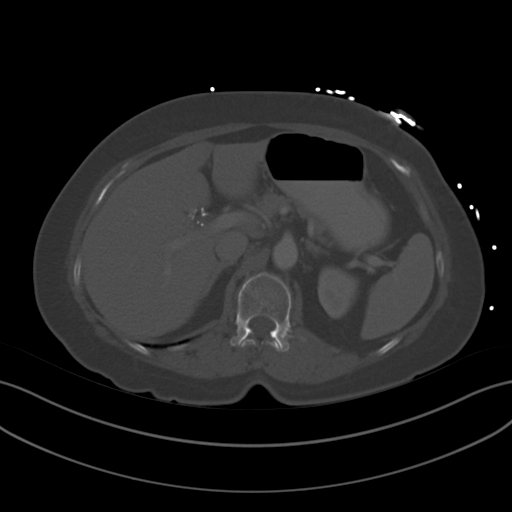
[im 71/85  soft-tissue]
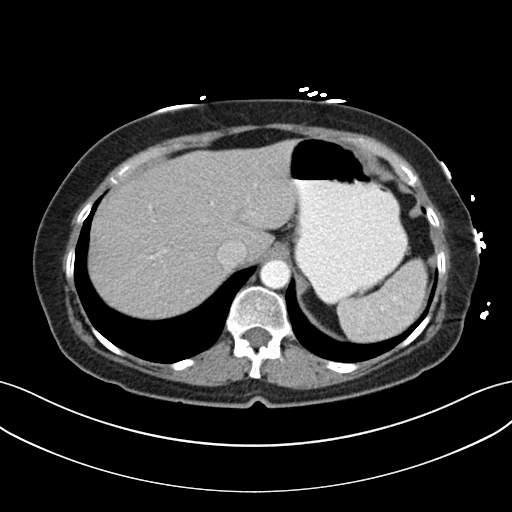
[im 78/85  soft-tissue]
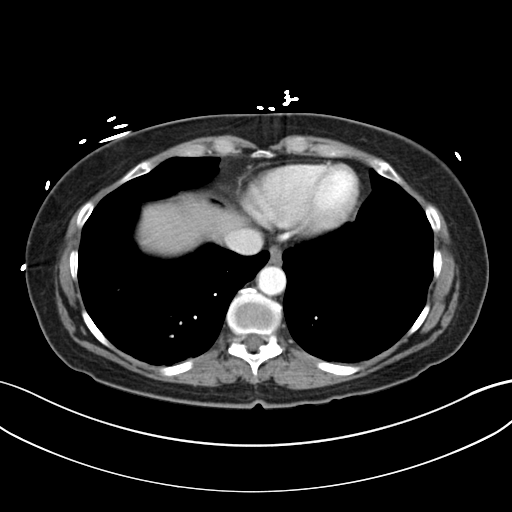

[Series 4: lung bases · axial · 0.75mm/px · z∈[-116,-86]mm · 3 of 61 slices shown]
[im 8/61  bone]
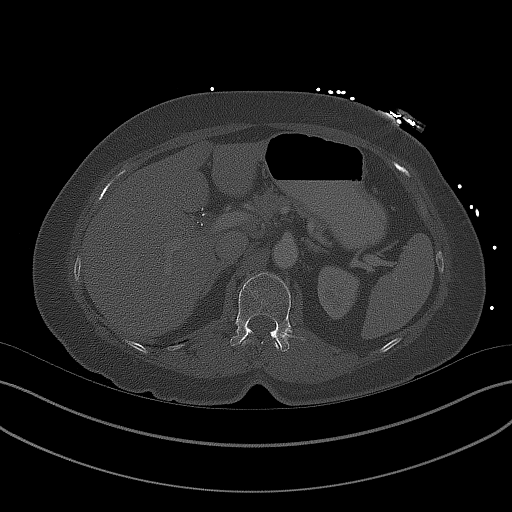
[im 16/61  bone]
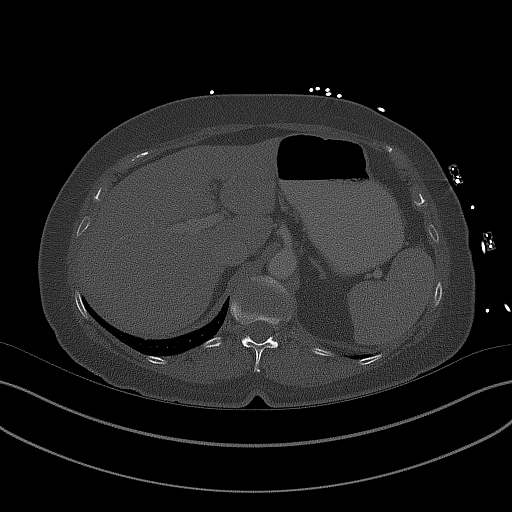
[im 23/61  bone]
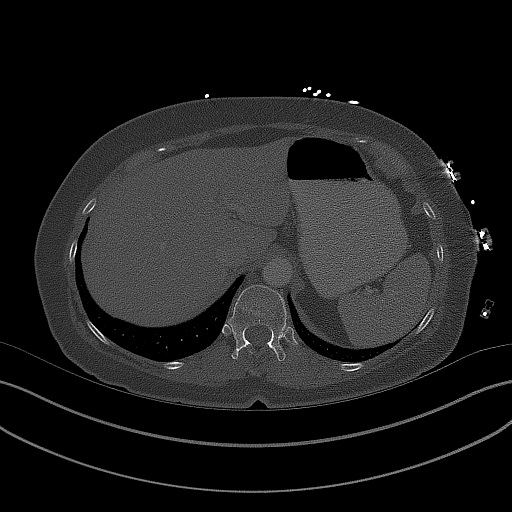

[Series 5: coronal st · coronal · 0.73mm/px · 3 of 98 slices shown]
[im 33/98  soft-tissue]
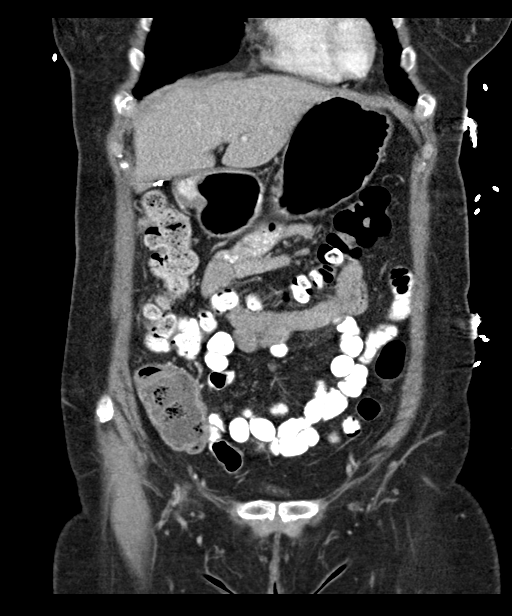
[im 44/98  soft-tissue]
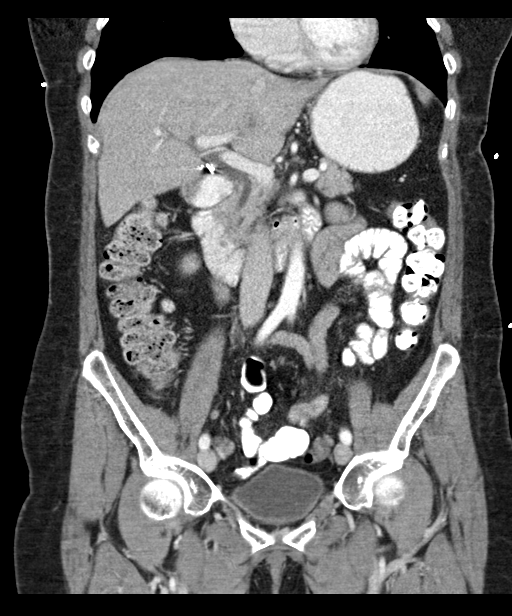
[im 54/98  soft-tissue]
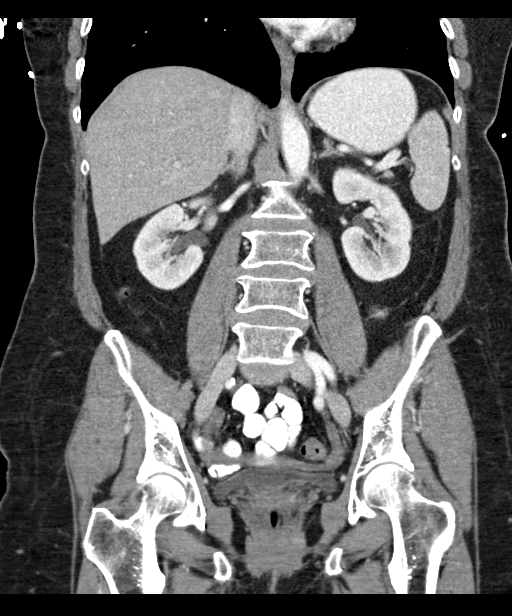

[17 of 46 positions shown; findings below may reference images not displayed]

FINDINGS: Lower chest: Lung bases are clear. No effusions. Heart is normal
size.

Hepatobiliary: No focal liver abnormality is seen. Status post
cholecystectomy. No biliary dilatation.

Pancreas: No focal abnormality or ductal dilatation.

Spleen: No focal abnormality.  Normal size.

Adrenals/Urinary Tract: No adrenal abnormality. No focal renal
abnormality. No stones or hydronephrosis. Urinary bladder is
unremarkable.

Stomach/Bowel: Sigmoid diverticulosis. No active diverticulitis.
Moderate stool burden throughout the colon. Appendix is normal.
Stomach and small bowel decompressed, unremarkable.

Vascular/Lymphatic: No evidence of aneurysm or adenopathy.

Reproductive: Uterus and adnexa unremarkable.  No mass.

Other: No free fluid or free air.

Musculoskeletal: No acute bony abnormality.
IMPRESSION: Moderate stool burden in the colon.  Normal appendix.

Sigmoid diverticulosis.  No active diverticulitis.

No acute findings in the abdomen or pelvis.

## 2020-09-25 DIAGNOSIS — F411 Generalized anxiety disorder: Secondary | ICD-10-CM | POA: Diagnosis present

## 2021-01-30 DIAGNOSIS — E042 Nontoxic multinodular goiter: Secondary | ICD-10-CM | POA: Insufficient documentation

## 2021-09-26 DIAGNOSIS — K219 Gastro-esophageal reflux disease without esophagitis: Secondary | ICD-10-CM | POA: Insufficient documentation

## 2022-07-18 DIAGNOSIS — M4727 Other spondylosis with radiculopathy, lumbosacral region: Secondary | ICD-10-CM | POA: Diagnosis present

## 2022-09-22 DIAGNOSIS — M533 Sacrococcygeal disorders, not elsewhere classified: Secondary | ICD-10-CM | POA: Insufficient documentation

## 2023-01-07 DIAGNOSIS — G8929 Other chronic pain: Secondary | ICD-10-CM | POA: Insufficient documentation

## 2023-04-03 DIAGNOSIS — M5412 Radiculopathy, cervical region: Secondary | ICD-10-CM | POA: Insufficient documentation

## 2023-04-24 DIAGNOSIS — G44209 Tension-type headache, unspecified, not intractable: Secondary | ICD-10-CM | POA: Insufficient documentation

## 2023-04-27 ENCOUNTER — Encounter (HOSPITAL_COMMUNITY): Payer: Self-pay

## 2023-04-27 ENCOUNTER — Other Ambulatory Visit: Payer: Self-pay

## 2023-04-27 ENCOUNTER — Emergency Department (HOSPITAL_COMMUNITY)
Admission: EM | Admit: 2023-04-27 | Discharge: 2023-04-27 | Disposition: A | Discharge status: Home / Self Care | Payer: Medicare Other | Attending: Emergency Medicine | Admitting: Emergency Medicine

## 2023-04-27 ENCOUNTER — Emergency Department (HOSPITAL_COMMUNITY): Payer: Medicare Other

## 2023-04-27 DIAGNOSIS — Z91040 Latex allergy status: Secondary | ICD-10-CM | POA: Diagnosis not present

## 2023-04-27 DIAGNOSIS — F419 Anxiety disorder, unspecified: Secondary | ICD-10-CM | POA: Insufficient documentation

## 2023-04-27 DIAGNOSIS — R42 Dizziness and giddiness: Secondary | ICD-10-CM | POA: Insufficient documentation

## 2023-04-27 DIAGNOSIS — M542 Cervicalgia: Secondary | ICD-10-CM | POA: Insufficient documentation

## 2023-04-27 DIAGNOSIS — R110 Nausea: Secondary | ICD-10-CM | POA: Diagnosis not present

## 2023-04-27 DIAGNOSIS — R519 Headache, unspecified: Secondary | ICD-10-CM | POA: Diagnosis present

## 2023-04-27 DIAGNOSIS — R030 Elevated blood-pressure reading, without diagnosis of hypertension: Secondary | ICD-10-CM | POA: Insufficient documentation

## 2023-04-27 LAB — CBC WITH DIFFERENTIAL/PLATELET
Abs Immature Granulocytes: 0.11 10*3/uL — ABNORMAL HIGH (ref 0.00–0.07)
Basophils Absolute: 0.1 10*3/uL (ref 0.0–0.1)
Basophils Relative: 1 %
Eosinophils Absolute: 0.1 10*3/uL (ref 0.0–0.5)
Eosinophils Relative: 1 %
HCT: 44.5 % (ref 36.0–46.0)
Hemoglobin: 15.5 g/dL — ABNORMAL HIGH (ref 12.0–15.0)
Immature Granulocytes: 1 %
Lymphocytes Relative: 25 %
Lymphs Abs: 2.3 10*3/uL (ref 0.7–4.0)
MCH: 30.2 pg (ref 26.0–34.0)
MCHC: 34.8 g/dL (ref 30.0–36.0)
MCV: 86.7 fL (ref 80.0–100.0)
Monocytes Absolute: 0.6 10*3/uL (ref 0.1–1.0)
Monocytes Relative: 7 %
Neutro Abs: 5.8 10*3/uL (ref 1.7–7.7)
Neutrophils Relative %: 65 %
Platelets: 305 10*3/uL (ref 150–400)
RBC: 5.13 MIL/uL — ABNORMAL HIGH (ref 3.87–5.11)
RDW: 11.6 % (ref 11.5–15.5)
WBC: 9 10*3/uL (ref 4.0–10.5)
nRBC: 0 % (ref 0.0–0.2)

## 2023-04-27 LAB — COMPREHENSIVE METABOLIC PANEL
ALT: 60 U/L — ABNORMAL HIGH (ref 0–44)
AST: 35 U/L (ref 15–41)
Albumin: 5 g/dL (ref 3.5–5.0)
Alkaline Phosphatase: 120 U/L (ref 38–126)
Anion gap: 11 (ref 5–15)
BUN: 9 mg/dL (ref 8–23)
CO2: 24 mmol/L (ref 22–32)
Calcium: 10 mg/dL (ref 8.9–10.3)
Chloride: 102 mmol/L (ref 98–111)
Creatinine, Ser: 0.73 mg/dL (ref 0.44–1.00)
GFR, Estimated: >60 mL/min (ref >60)
Glucose, Bld: 101 mg/dL — ABNORMAL HIGH (ref 70–99)
Potassium: 3.4 mmol/L — ABNORMAL LOW (ref 3.5–5.1)
Sodium: 137 mmol/L (ref 135–145)
Total Bilirubin: 1.7 mg/dL — ABNORMAL HIGH (ref 0.3–1.2)
Total Protein: 8.4 g/dL — ABNORMAL HIGH (ref 6.5–8.1)

## 2023-04-27 LAB — URINALYSIS, ROUTINE W REFLEX MICROSCOPIC
Bilirubin Urine: NEGATIVE
Glucose, UA: NEGATIVE mg/dL
Hgb urine dipstick: NEGATIVE
Ketones, ur: 20 mg/dL — AB
Leukocytes,Ua: NEGATIVE
Nitrite: NEGATIVE
Protein, ur: NEGATIVE mg/dL
Specific Gravity, Urine: 1.002 — ABNORMAL LOW (ref 1.005–1.030)
pH: 8 (ref 5.0–8.0)

## 2023-04-27 LAB — ACETAMINOPHEN LEVEL: Acetaminophen (Tylenol), Serum: <10 ug/mL — ABNORMAL LOW (ref 10–30)

## 2023-04-27 MED ORDER — DIAZEPAM 5 MG PO TABS
5.0000 mg | ORAL_TABLET | Freq: Once | ORAL | Status: AC
  Administered 2023-04-27: 5 mg via ORAL
  Filled 2023-04-27: qty 1

## 2023-04-27 NOTE — ED Triage Notes (Signed)
BIB EMS from home for lower neck pain that has been ongoing since April and has had multiple scans with no findings. Pt does see chiropractor and pain clinic for this issue. Additional c/o dizziness and nausea with this that has been ongoing as well and she is seeing neurologist for this.

## 2023-04-27 NOTE — ED Notes (Signed)
Pt to ct via stretcher

## 2023-04-27 NOTE — ED Provider Notes (Signed)
[place] EMERGENCY DEPARTMENT AT [facility] Provider Note   CSN: [id number] Arrival date & time: 04/27/23  1146     History  Chief Complaint  Patient presents with   Neck Pain    Christy Evans is a 72 y.o. female.  The history is provided by the patient, medical records and the EMS personnel. No language interpreter was used.  Neck Pain    72 year old female brought here via EMS from home with complaints of neck pain.  Patient with history of chronic low back pain who developed new onset of neck pain and left upper extremity radicular pain for which she was evaluated at the pain management clinic on 6/28.  She had a cervical spine MRI that was done fairly recently here which shows a trace anterolisthesis of C2-3 without any significant central or foraminal stenosis and no cord signal.  She received intramuscular Decadron at the pain clinic as well as topical compounding cream.  Patient however does endorse having episodic dizziness with change in position as well as some vision changes.  Patient states for the past 3 months she has had pain in her neck as well as her head.  She described neck pain as a stiffness sensation with bouts of dizziness upon positional change.  She also complaining of pain to the back of her head that radiates towards her forehead for the past 3 weeks and becoming increasingly more intolerable.  She endorsed bouts of dizziness and lightheadedness with head movement.  She felt like her vision is changing with blurriness and murkiness to both eyes.  She endorsed bouts of nausea waking up at night without vomiting.  She is having trouble ambulating due to her symptoms.  She reports she has been seen by her primary care doctor for this as well as her neurologist and her neurosurgeon and her ophthalmologist as well as her pain clinic specialist for the past several months without any definitive diagnosis.  She has had steroid eyedrops recently because her eyes  now is getting dry.  She also received chiropractic care without relief.  She mention having a neck MRI done recently and was told that there is nothing wrong with it.  She voiced frustration that her symptoms still persist.  No history of cancer.  Home Medications Prior to Admission medications   Medication Sig Start Date End Date Taking? Authorizing Provider  acidophilus (RISAQUAD) CAPS capsule Take by mouth daily.    [provider]  B Complex Vitamins (VITAMIN B COMPLEX PO) Take 1 tablet by mouth daily.    [provider]  cefdinir (OMNICEF) 300 MG capsule Take 300 mg by mouth daily. 08/23/18   [provider]  cholecalciferol (VITAMIN D) 1000 UNITS tablet Take 1,000 Units by mouth daily.    [provider]  Coenzyme Q10 (CO Q-10) 200 MG CAPS Take 200 mg by mouth daily.    [provider]  DOCUSATE SODIUM PO Take 1 capsule by mouth daily.    [provider]  EVENING PRIMROSE OIL PO Take 1 capsule by mouth daily.     [provider]  fluconazole (DIFLUCAN) 150 MG tablet Take 150 mg by mouth 2 (two) times daily. For 7 days 08/29/18   [provider]  levothyroxine (SYNTHROID, LEVOTHROID) 25 MCG tablet Take 25 mcg by mouth daily. 07/20/18 10/18/18  [provider]  naproxen sodium (ALEVE) 220 MG tablet Take 220 mg by mouth daily as needed (headache).    [provider]  ondansetron (ZOFRAN ODT) 4 MG disintegrating tablet Take 1 tablet (4 mg total) by mouth every 8 (eight) hours as needed for nausea or vomiting. 09/07/18   Shaune Pollack, MD  OVER THE COUNTER MEDICATION Hpf cholestate.    [provider]  oxyCODONE (ROXICODONE) 5 MG immediate release tablet Take 1 tablet (5 mg total) by mouth every 4 (four) hours as needed for moderate pain or severe pain. 09/07/18   Shaune Pollack, MD  oxyCODONE (ROXICODONE) 5 MG immediate release tablet Take 1 tablet (5 mg total) by mouth every 4 (four) hours as  needed for severe pain. 09/09/18   Tegeler, Canary Brim, MD  Polyethyl Glycol-Propyl Glycol (SYSTANE) 0.4-0.3 % GEL ophthalmic gel Place 1 application into both eyes 3 (three) times daily as needed (dry eyes).    [provider]  potassium chloride SA (K-DUR,KLOR-CON) 20 MEQ tablet Take 1 tablet (20 mEq total) by mouth 2 (two) times daily for 3 days. 09/07/18 09/10/18  Shaune Pollack, MD      Allergies    Acetaminophen, Influenza virus vaccine, Latex, Nitrofurantoin macrocrystal, and Tape    Review of Systems   Review of Systems  Musculoskeletal:  Positive for neck pain.  All other systems reviewed and are negative.   Physical Exam Updated Vital Signs BP (!) 150/92   Pulse 76   Temp 97.8 F (36.6 C) (Oral)   Resp 18   Ht 5' 3.5" (1.613 m)   Wt 65.8 kg   SpO2 100%   BMI 25.28 kg/m  Physical Exam Vitals and nursing note reviewed.  Constitutional:      Appearance: She is well-developed.     Comments: Patient is tearful appears anxious  HENT:     Head: Normocephalic and atraumatic.     Right Ear: Tympanic membrane normal.     Left Ear: Tympanic membrane normal.     Mouth/Throat:     Mouth: Mucous membranes are dry.  Eyes:     Extraocular Movements: Extraocular movements intact.     Conjunctiva/sclera: Conjunctivae normal.     Pupils: Pupils are equal, round, and reactive to light.  Pulmonary:     Effort: Pulmonary effort is normal.  Abdominal:     Palpations: Abdomen is soft.     Tenderness: There is no abdominal tenderness.  Musculoskeletal:     Cervical back: Normal range of motion and neck supple. Tenderness (Diffuse tenderness noted to bilateral cervical.  Spinal muscle without any skin changes no carotid bruit) present.  Skin:    Findings: No rash.  Neurological:     Mental Status: She is alert and oriented to person, place, and time.     GCS: GCS eye subscore is 4. GCS verbal subscore is 5. GCS motor subscore is 6.     Cranial Nerves: Cranial nerves  2-12 are intact.     Sensory: Sensation is intact.     Motor: Motor function is intact.     Coordination: Coordination is intact.  Psychiatric:        Mood and Affect: Mood normal.     ED Results / Procedures / Treatments   Labs (all labs ordered are listed, but only abnormal results are displayed) Labs Reviewed  CBC WITH DIFFERENTIAL/PLATELET - Abnormal; Notable for the following components:      Result Value   RBC 5.13 (*)    Hemoglobin 15.5 (*)    Abs Immature Granulocytes 0.11 (*)    All other components within normal limits  ACETAMINOPHEN LEVEL -  Abnormal; Notable for the following components:   Acetaminophen (Tylenol), Serum <10 (*)    All other components within normal limits  COMPREHENSIVE METABOLIC PANEL - Abnormal; Notable for the following components:   Potassium 3.4 (*)    Glucose, Bld 101 (*)    Total Protein 8.4 (*)    ALT 60 (*)    Total Bilirubin 1.7 (*)    All other components within normal limits  URINALYSIS, ROUTINE W REFLEX MICROSCOPIC - Abnormal; Notable for the following components:   Color, Urine STRAW (*)    Specific Gravity, Urine 1.002 (*)    Ketones, ur 20 (*)    All other components within normal limits    EKG None  Radiology CT Head Wo Contrast  Result Date: 04/27/2023 CLINICAL DATA:  Provided history: Head trauma, moderate/severe. EXAM: CT HEAD WITHOUT CONTRAST TECHNIQUE: Contiguous axial images were obtained from the base of the skull through the vertex without intravenous contrast. RADIATION DOSE REDUCTION: This exam was performed according to the departmental dose-optimization program which includes automated exposure control, adjustment of the mA and/or kV according to patient size and/or use of iterative reconstruction technique. COMPARISON:  No pertinent prior exams available for comparison. FINDINGS: Brain: No age advanced or lobar predominant parenchymal atrophy. Partially empty sella turcica. There is no acute intracranial hemorrhage. No  demarcated cortical infarct. No extra-axial fluid collection. No evidence of an intracranial mass. No midline shift. Vascular: No hyperdense vessel.  Atherosclerotic calcifications. Skull: No calvarial fracture or aggressive osseous lesion. Sinuses/Orbits: No mass or acute finding within the imaged orbits. No significant paranasal sinus disease at the imaged levels. IMPRESSION: No evidence of an acute intracranial abnormality. Electronically Signed   By: Jackey Loge D.O.   On: 04/27/2023 13:24    Procedures Procedures    Medications Ordered in ED Medications  diazepam (VALIUM) tablet 5 mg (5 mg Oral Given 04/27/23 1244)    ED Course/ Medical Decision Making/ A&P                             Medical Decision Making Amount and/or Complexity of Data Reviewed Labs: ordered. Radiology: ordered.  Risk Prescription drug management.   BP (!) 150/92   Pulse 76   Temp 97.8 F (36.6 C) (Oral)   Resp 18   Ht 5' 3.5" (1.613 m)   Wt 65.8 kg   SpO2 100%   BMI 25.28 kg/m   80:4 PM  72 year old female brought here via EMS from home with complaints of neck pain.  Patient with history of chronic low back pain who developed new onset of neck pain and left upper extremity radicular pain for which she was evaluated at the pain management clinic on 6/28.  She had a cervical spine MRI that was done fairly recently here which shows a trace anterolisthesis of C2-3 without any significant central or foraminal stenosis and no cord signal.  She received intramuscular Decadron at the pain clinic as well as topical compounding cream.  Patient however does endorse having episodic dizziness with change in position as well as some vision changes.  Patient states for the past 3 months she has had pain in her neck as well as her head.  She described neck pain as a stiffness sensation with bouts of dizziness upon positional change.  She also complaining of pain to the back of her head that radiates towards her  forehead for the past 3 weeks and becoming increasingly  more intolerable.  She endorsed bouts of dizziness and lightheadedness with head movement.  She felt like her vision is changing with blurriness and murkiness to both eyes.  She endorsed bouts of nausea waking up at night without vomiting.  She is having trouble ambulating due to her symptoms.  She reports she has been seen by her primary care doctor for this as well as her neurologist and her neurosurgeon and her ophthalmologist as well as her pain clinic specialist for the past several months without any definitive diagnosis.  She has had steroid eyedrops recently because her eyes now is getting dry.  She also received chiropractic care without relief.  She mention having a neck MRI done recently and was told that there is nothing wrong with it.  She voiced frustration that her symptoms still persist.  No history of cancer.  On exam this is an elderly female appears very anxious and tearful.  Head is normocephalic atraumatic, normal TMs, neck with tenderness to palpation bilaterally and maintaining normal range of motion.  Heart with normal rate and rhythm, lungs clear to auscultation abdomen is soft nontender patient has no focal neurodeficit her coordination is intact.  Her strength is intact throughout.  Vital signs notable for elevated blood pressure of 150/92.  She is afebrile no hypoxia.  EMR reviewed patient has been seen evaluated by multiple specialist for complaints of headache and neck pain with note from pain management clinic as recent as 3 days ago.  She recently had an MRI of her cervical spine done a week ago showing trace anterolisthesis of the C2-3 but no other concerning feature.  Given her presentation, will give muscle accident, obtain CT scan, and check labs.  -Labs ordered, independently viewed and interpreted by me.  Labs remarkable for reassuring value and at baseline -The patient was maintained on a cardiac monitor.  I  personally viewed and interpreted the cardiac monitored which showed an underlying rhythm of: NSR -Imaging independently viewed and interpreted by me and I agree with radiologist's interpretation.  Result remarkable for head CT without concerning finding -This patient presents to the ED for concern of headache and multiple complaints, this involves an extensive number of treatment options, and is a complaint that carries with it a high risk of complications and morbidity.  The differential diagnosis includes malignancy, worried well, MSK, radicular neck pain, meningitis, temporal arteritis, IIH -Co morbidities that complicate the patient evaluation includes spinal stenosis -Treatment includes valium -Reevaluation of the patient after these medicines showed that the patient improved -PCP office notes or outside notes reviewed -Discussion with specialist attending DR. STeinl -Escalation to admission/observation considered: patients feels much better, is comfortable with discharge, and will follow up with neurosurgeon Dr. Danielle Dess -Prescription medication considered, patient comfortable with home medication -Social Determinant of Health considered         Final Clinical Impression(s) / ED Diagnoses Final diagnoses:  Bad headache    Rx / DC Orders ED Discharge Orders     None         Fayrene Helper, PA-C 04/27/23 1525    Cathren Laine, MD 04/28/23 1319

## 2023-04-27 NOTE — Discharge Instructions (Signed)
Please call and follow-up closely with your neurosurgeon for further managements of your condition.  Continue to take your home pain medication as needed.  Return if you have any concern.

## 2023-10-15 DIAGNOSIS — M791 Myalgia, unspecified site: Secondary | ICD-10-CM | POA: Insufficient documentation

## 2024-03-01 DIAGNOSIS — R42 Dizziness and giddiness: Secondary | ICD-10-CM | POA: Insufficient documentation

## 2024-07-15 ENCOUNTER — Other Ambulatory Visit: Payer: Self-pay | Admitting: Family Medicine

## 2024-07-15 DIAGNOSIS — Z1231 Encounter for screening mammogram for malignant neoplasm of breast: Secondary | ICD-10-CM

## 2024-07-22 ENCOUNTER — Ambulatory Visit: Payer: PRIVATE HEALTH INSURANCE

## 2024-07-30 ENCOUNTER — Emergency Department (HOSPITAL_COMMUNITY)

## 2024-07-30 ENCOUNTER — Other Ambulatory Visit: Payer: Self-pay

## 2024-07-30 ENCOUNTER — Emergency Department (HOSPITAL_COMMUNITY): Admission: EM | Admit: 2024-07-30 | Discharge: 2024-07-30 | Disposition: A | Discharge status: Home / Self Care

## 2024-07-30 DIAGNOSIS — Z91040 Latex allergy status: Secondary | ICD-10-CM | POA: Insufficient documentation

## 2024-07-30 DIAGNOSIS — R053 Chronic cough: Secondary | ICD-10-CM | POA: Diagnosis not present

## 2024-07-30 DIAGNOSIS — F419 Anxiety disorder, unspecified: Secondary | ICD-10-CM | POA: Diagnosis not present

## 2024-07-30 DIAGNOSIS — R7309 Other abnormal glucose: Secondary | ICD-10-CM | POA: Diagnosis not present

## 2024-07-30 DIAGNOSIS — R1084 Generalized abdominal pain: Secondary | ICD-10-CM

## 2024-07-30 DIAGNOSIS — K5900 Constipation, unspecified: Secondary | ICD-10-CM | POA: Insufficient documentation

## 2024-07-30 DIAGNOSIS — R1032 Left lower quadrant pain: Secondary | ICD-10-CM | POA: Diagnosis present

## 2024-07-30 DIAGNOSIS — E039 Hypothyroidism, unspecified: Secondary | ICD-10-CM | POA: Diagnosis not present

## 2024-07-30 LAB — URINALYSIS, ROUTINE W REFLEX MICROSCOPIC
Bilirubin Urine: NEGATIVE
Glucose, UA: NEGATIVE mg/dL
Hgb urine dipstick: NEGATIVE
Ketones, ur: NEGATIVE mg/dL
Leukocytes,Ua: NEGATIVE
Nitrite: NEGATIVE
Protein, ur: NEGATIVE mg/dL
Specific Gravity, Urine: 1.009 (ref 1.005–1.030)
pH: 7 (ref 5.0–8.0)

## 2024-07-30 LAB — CBC
HCT: 42.7 % (ref 36.0–46.0)
Hemoglobin: 14.4 g/dL (ref 12.0–15.0)
MCH: 29.4 pg (ref 26.0–34.0)
MCHC: 33.7 g/dL (ref 30.0–36.0)
MCV: 87.1 fL (ref 80.0–100.0)
Platelets: 281 K/uL (ref 150–400)
RBC: 4.9 MIL/uL (ref 3.87–5.11)
RDW: 12.4 % (ref 11.5–15.5)
WBC: 11.2 K/uL — ABNORMAL HIGH (ref 4.0–10.5)
nRBC: 0 % (ref 0.0–0.2)

## 2024-07-30 LAB — COMPREHENSIVE METABOLIC PANEL WITH GFR
ALT: 27 U/L (ref 0–44)
AST: 26 U/L (ref 15–41)
Albumin: 4.4 g/dL (ref 3.5–5.0)
Alkaline Phosphatase: 118 U/L (ref 38–126)
Anion gap: 15 (ref 5–15)
BUN: 9 mg/dL (ref 8–23)
CO2: 20 mmol/L — ABNORMAL LOW (ref 22–32)
Calcium: 9.9 mg/dL (ref 8.9–10.3)
Chloride: 104 mmol/L (ref 98–111)
Creatinine, Ser: 0.61 mg/dL (ref 0.44–1.00)
GFR, Estimated: >60 mL/min (ref >60)
Glucose, Bld: 111 mg/dL — ABNORMAL HIGH (ref 70–99)
Potassium: 3.6 mmol/L (ref 3.5–5.1)
Sodium: 139 mmol/L (ref 135–145)
Total Bilirubin: 1.3 mg/dL — ABNORMAL HIGH (ref 0.0–1.2)
Total Protein: 7.3 g/dL (ref 6.5–8.1)

## 2024-07-30 LAB — D-DIMER, QUANTITATIVE: D-Dimer, Quant: 0.5 ug{FEU}/mL (ref 0.00–0.50)

## 2024-07-30 LAB — LIPASE, BLOOD: Lipase: 21 U/L (ref 11–51)

## 2024-07-30 LAB — TROPONIN T, HIGH SENSITIVITY: Troponin T High Sensitivity: <15 ng/L (ref 0–19)

## 2024-07-30 MED ORDER — FENTANYL CITRATE PF 50 MCG/ML IJ SOSY
50.0000 ug | PREFILLED_SYRINGE | Freq: Once | INTRAMUSCULAR | Status: AC
  Administered 2024-07-30: 50 ug via INTRAVENOUS
  Filled 2024-07-30: qty 1

## 2024-07-30 MED ORDER — LORAZEPAM 0.5 MG PO TABS
0.5000 mg | ORAL_TABLET | Freq: Once | ORAL | Status: AC
Start: 2024-07-30 — End: 2024-07-30
  Administered 2024-07-30: 0.5 mg via ORAL
  Filled 2024-07-30: qty 1

## 2024-07-30 MED ORDER — LORAZEPAM 0.5 MG PO TABS
0.5000 mg | ORAL_TABLET | Freq: Once | ORAL | Status: AC
  Administered 2024-07-30: 0.5 mg via ORAL
  Filled 2024-07-30: qty 1

## 2024-07-30 MED ORDER — IOHEXOL 300 MG/ML  SOLN
100.0000 mL | Freq: Once | INTRAMUSCULAR | Status: AC | PRN
Start: 2024-07-30 — End: 2024-07-30
  Administered 2024-07-30: 100 mL via INTRAVENOUS

## 2024-07-30 NOTE — ED Notes (Signed)
 Pt extremely anxious, tearful, unable to gather her thoughts due to anxiety.  Reassured of plan and that we are awaiting CT

## 2024-07-30 NOTE — Discharge Instructions (Signed)
 You were seen in the ER today for evaluation of your symptoms. Please make sure you follow up with your specialists for evaluation of your symptoms. Please follow the bowel prep guide given to you by your GI provider. Work on ways to lower your stress and anxiety. Follow up with your therapist. You are constipated here, I have included additional information on this. If you have any concerns, new or worsening symptoms, please return to the ER.   Contact a health care provider if: You have pain that gets worse. You have a fever. You do not have a bowel movement after 4 days. You vomit. You are not hungry or you lose weight. You are bleeding from the opening between the buttocks (anus). You have thin, pencil-like stools. Get help right away if: You have a fever and your symptoms suddenly get worse. You leak stool or have blood in your stool. Your abdomen is bloated. You have severe pain in your abdomen. You feel dizzy or you faint.

## 2024-07-30 NOTE — ED Triage Notes (Addendum)
 Patient to ED by POV with c/o left lower back pain. Pain radiates to back and right ABD area. She was seen by PCP yesterday but states pain has worsened. She also c/o cough reports brown/pinkish phlegm today. PCP told her it was just allergies. She denies N/V/ fever or chills. When she inhales states pain increases.

## 2024-07-30 NOTE — ED Provider Notes (Incomplete)
 [place] EMERGENCY DEPARTMENT AT [facility] Provider Note   CSN: [id number] Arrival date & time: 07/30/24  9064     Patient presents with: Abdominal Pain and Cough   Christy Evans is a 73 y.o. female.  {Add pertinent medical, surgical, social history, OB history to HPI:32947}  Abdominal Pain Associated symptoms: cough   Cough      Prior to Admission medications   Medication Sig Start Date End Date Taking? Authorizing Provider  acidophilus (RISAQUAD) CAPS capsule Take by mouth daily.    [provider]  B Complex Vitamins (VITAMIN B COMPLEX PO) Take 1 tablet by mouth daily.    [provider]  cefdinir (OMNICEF) 300 MG capsule Take 300 mg by mouth daily. 08/23/18   [provider]  cholecalciferol (VITAMIN D) 1000 UNITS tablet Take 1,000 Units by mouth daily.    [provider]  Coenzyme Q10 (CO Q-10) 200 MG CAPS Take 200 mg by mouth daily.    [provider]  DOCUSATE SODIUM PO Take 1 capsule by mouth daily.    [provider]  EVENING PRIMROSE OIL PO Take 1 capsule by mouth daily.     [provider]  levothyroxine (SYNTHROID, LEVOTHROID) 25 MCG tablet Take 25 mcg by mouth daily. 07/20/18 10/18/18  [provider]  naproxen sodium (ALEVE) 220 MG tablet Take 220 mg by mouth daily as needed (headache).    [provider]  OVER THE COUNTER MEDICATION Hpf cholestate.    [provider]  Polyethyl Glycol-Propyl Glycol (SYSTANE) 0.4-0.3 % GEL ophthalmic gel Place 1 application into both eyes 3 (three) times daily as needed (dry eyes).    [provider]    Allergies: Acetaminophen , Influenza virus vaccine, Latex, Nitrofurantoin macrocrystal, and Tape    Review of Systems  Respiratory:  Positive for cough.   Gastrointestinal:  Positive for abdominal pain.    Updated Vital Signs BP (!) 152/79 (BP Location: Left Arm)   Pulse 91   Temp (!) 96.6 F (35.9 C) (Oral)    Resp 20   Ht 5' 4 (1.626 m)   Wt 58.1 kg   SpO2 100%   BMI 21.97 kg/m   Physical Exam  (all labs ordered are listed, but only abnormal results are displayed) Labs Reviewed  CBC - Abnormal; Notable for the following components:      Result Value   WBC 11.2 (*)    All other components within normal limits  COMPREHENSIVE METABOLIC PANEL WITH GFR - Abnormal; Notable for the following components:   CO2 20 (*)    Glucose, Bld 111 (*)    Total Bilirubin 1.3 (*)    All other components within normal limits  URINALYSIS, ROUTINE W REFLEX MICROSCOPIC  LIPASE, BLOOD  D-DIMER, QUANTITATIVE  TROPONIN T, HIGH SENSITIVITY  TROPONIN T, HIGH SENSITIVITY    EKG: None  Radiology: DG Chest 2 View Result Date: 07/30/2024 CLINICAL DATA:  cough EXAM: CHEST - 2 VIEW COMPARISON:  Chest x-ray 09/07/2018 FINDINGS: The heart and mediastinal contours are within normal limits. No focal consolidation. No pulmonary edema. No pleural effusion. No pneumothorax. No acute osseous abnormality. Right shoulder rotator cuff anchor suture noted. IMPRESSION: No active cardiopulmonary disease. Electronically Signed   By: Morgane  Naveau M.D.   On: 07/30/2024 11:59    {Document cardiac monitor, telemetry assessment procedure when appropriate:32947} Procedures   Medications Ordered in the ED  iohexol  (OMNIPAQUE ) 300 MG/ML solution 100 mL (100 mLs Intravenous Contrast Given 07/30/24 1456)  LORazepam (ATIVAN) tablet 0.5 mg (0.5 mg Oral Given 07/30/24 1438)  fentaNYL (SUBLIMAZE) injection 50 mcg (50 mcg Intravenous Given 07/30/24 1438)      {Click here for ABCD2, HEART and other calculators REFRESH Note before signing:1}                              Medical Decision Making Amount and/or Complexity of Data Reviewed Labs: ordered. Radiology: ordered.  Risk Prescription drug management.   ***  {Document critical care time when appropriate  Document review of labs and clinical decision tools ie CHADS2VASC2,  etc  Document your independent review of radiology images and any outside records  Document your discussion with family members, caretakers and with consultants  Document social determinants of health affecting pt's care  Document your decision making why or why not admission, treatments were needed:32947:::1}   Final diagnoses:  None    ED Discharge Orders     None

## 2024-08-01 ENCOUNTER — Encounter (HOSPITAL_COMMUNITY): Payer: Self-pay | Admitting: Emergency Medicine

## 2024-08-01 ENCOUNTER — Other Ambulatory Visit: Payer: Self-pay

## 2024-08-01 ENCOUNTER — Observation Stay (HOSPITAL_COMMUNITY)
Admission: EM | Admit: 2024-08-01 | Discharge: 2024-08-02 | Disposition: A | Discharge status: Home / Self Care | Attending: Internal Medicine | Admitting: Internal Medicine

## 2024-08-01 ENCOUNTER — Emergency Department (HOSPITAL_COMMUNITY)

## 2024-08-01 DIAGNOSIS — I2510 Atherosclerotic heart disease of native coronary artery without angina pectoris: Secondary | ICD-10-CM | POA: Insufficient documentation

## 2024-08-01 DIAGNOSIS — M4727 Other spondylosis with radiculopathy, lumbosacral region: Secondary | ICD-10-CM | POA: Diagnosis not present

## 2024-08-01 DIAGNOSIS — Z79899 Other long term (current) drug therapy: Secondary | ICD-10-CM | POA: Insufficient documentation

## 2024-08-01 DIAGNOSIS — E039 Hypothyroidism, unspecified: Secondary | ICD-10-CM | POA: Insufficient documentation

## 2024-08-01 DIAGNOSIS — K5900 Constipation, unspecified: Secondary | ICD-10-CM | POA: Insufficient documentation

## 2024-08-01 DIAGNOSIS — F411 Generalized anxiety disorder: Secondary | ICD-10-CM | POA: Insufficient documentation

## 2024-08-01 DIAGNOSIS — R109 Unspecified abdominal pain: Secondary | ICD-10-CM | POA: Diagnosis present

## 2024-08-01 DIAGNOSIS — M8580 Other specified disorders of bone density and structure, unspecified site: Secondary | ICD-10-CM | POA: Insufficient documentation

## 2024-08-01 DIAGNOSIS — M419 Scoliosis, unspecified: Secondary | ICD-10-CM | POA: Insufficient documentation

## 2024-08-01 DIAGNOSIS — E872 Acidosis, unspecified: Secondary | ICD-10-CM | POA: Insufficient documentation

## 2024-08-01 DIAGNOSIS — Z91040 Latex allergy status: Secondary | ICD-10-CM | POA: Insufficient documentation

## 2024-08-01 DIAGNOSIS — K5791 Diverticulosis of intestine, part unspecified, without perforation or abscess with bleeding: Secondary | ICD-10-CM | POA: Diagnosis not present

## 2024-08-01 DIAGNOSIS — J019 Acute sinusitis, unspecified: Secondary | ICD-10-CM | POA: Insufficient documentation

## 2024-08-01 LAB — URINALYSIS, ROUTINE W REFLEX MICROSCOPIC
Bilirubin Urine: NEGATIVE
Glucose, UA: NEGATIVE mg/dL
Hgb urine dipstick: NEGATIVE
Ketones, ur: 5 mg/dL — AB
Leukocytes,Ua: NEGATIVE
Nitrite: NEGATIVE
Protein, ur: NEGATIVE mg/dL
Specific Gravity, Urine: 1.004 — ABNORMAL LOW (ref 1.005–1.030)
pH: 8 (ref 5.0–8.0)

## 2024-08-01 LAB — COMPREHENSIVE METABOLIC PANEL WITH GFR
ALT: 32 U/L (ref 0–44)
AST: 30 U/L (ref 15–41)
Albumin: 4.8 g/dL (ref 3.5–5.0)
Alkaline Phosphatase: 128 U/L — ABNORMAL HIGH (ref 38–126)
Anion gap: 16 — ABNORMAL HIGH (ref 5–15)
BUN: 8 mg/dL (ref 8–23)
CO2: 19 mmol/L — ABNORMAL LOW (ref 22–32)
Calcium: 10.1 mg/dL (ref 8.9–10.3)
Chloride: 99 mmol/L (ref 98–111)
Creatinine, Ser: 0.61 mg/dL (ref 0.44–1.00)
GFR, Estimated: >60 mL/min (ref >60)
Glucose, Bld: 120 mg/dL — ABNORMAL HIGH (ref 70–99)
Potassium: 3.4 mmol/L — ABNORMAL LOW (ref 3.5–5.1)
Sodium: 134 mmol/L — ABNORMAL LOW (ref 135–145)
Total Bilirubin: 1.2 mg/dL (ref 0.0–1.2)
Total Protein: 7.6 g/dL (ref 6.5–8.1)

## 2024-08-01 LAB — CBC
HCT: 42.8 % (ref 36.0–46.0)
Hemoglobin: 15.1 g/dL — ABNORMAL HIGH (ref 12.0–15.0)
MCH: 30.3 pg (ref 26.0–34.0)
MCHC: 35.3 g/dL (ref 30.0–36.0)
MCV: 85.8 fL (ref 80.0–100.0)
Platelets: 295 K/uL (ref 150–400)
RBC: 4.99 MIL/uL (ref 3.87–5.11)
RDW: 12.3 % (ref 11.5–15.5)
WBC: 7.7 K/uL (ref 4.0–10.5)
nRBC: 0 % (ref 0.0–0.2)

## 2024-08-01 LAB — LACTIC ACID, PLASMA
Lactic Acid, Venous: 2.2 mmol/L (ref 0.5–1.9)
Lactic Acid, Venous: 1.8 mmol/L (ref 0.5–1.9)

## 2024-08-01 LAB — TROPONIN T, HIGH SENSITIVITY
Troponin T High Sensitivity: <15 ng/L (ref 0–19)
Troponin T High Sensitivity: <15 ng/L (ref 0–19)

## 2024-08-01 LAB — POC OCCULT BLOOD, ED: Fecal Occult Bld: NEGATIVE

## 2024-08-01 LAB — MAGNESIUM: Magnesium: 2.1 mg/dL (ref 1.7–2.4)

## 2024-08-01 LAB — TSH: TSH: 4.6 u[IU]/mL — ABNORMAL HIGH (ref 0.350–4.500)

## 2024-08-01 LAB — PHOSPHORUS: Phosphorus: 3 mg/dL (ref 2.5–4.6)

## 2024-08-01 LAB — LIPASE, BLOOD: Lipase: 45 U/L (ref 11–51)

## 2024-08-01 MED ORDER — FENTANYL CITRATE PF 50 MCG/ML IJ SOSY
50.0000 ug | PREFILLED_SYRINGE | Freq: Once | INTRAMUSCULAR | Status: AC
  Administered 2024-08-01: 50 ug via INTRAVENOUS
  Filled 2024-08-01: qty 1

## 2024-08-01 MED ORDER — SMOG ENEMA
300.0000 mL | Freq: Once | RECTAL | Status: AC
  Administered 2024-08-01: 300 mL via RECTAL
  Filled 2024-08-01: qty 960

## 2024-08-01 MED ORDER — SMOG ENEMA: 960.0000 mL | Freq: Once | RECTAL | Status: DC | PRN

## 2024-08-01 MED ORDER — POTASSIUM CHLORIDE IN NACL 40-0.9 MEQ/L-% IV SOLN: INTRAVENOUS | Status: DC

## 2024-08-01 MED ORDER — POTASSIUM CHLORIDE CRYS ER 20 MEQ PO TBCR
40.0000 meq | EXTENDED_RELEASE_TABLET | Freq: Once | ORAL | Status: DC
  Filled 2024-08-01 (×2): qty 2

## 2024-08-01 MED ORDER — PROCHLORPERAZINE EDISYLATE 10 MG/2ML IJ SOLN: 5.0000 mg | Freq: Four times a day (QID) | INTRAMUSCULAR | Status: DC | PRN

## 2024-08-01 MED ORDER — KETOROLAC TROMETHAMINE 30 MG/ML IJ SOLN
15.0000 mg | Freq: Once | INTRAMUSCULAR | Status: AC
Start: 2024-08-01 — End: 2024-08-01
  Administered 2024-08-01: 15 mg via INTRAVENOUS
  Filled 2024-08-01: qty 1

## 2024-08-01 MED ORDER — LORAZEPAM 2 MG/ML IJ SOLN
0.5000 mg | Freq: Once | INTRAMUSCULAR | Status: AC | PRN
  Administered 2024-08-01: 0.5 mg via INTRAVENOUS
  Filled 2024-08-01: qty 1

## 2024-08-01 MED ORDER — POLYETHYLENE GLYCOL 3350 17 G PO PACK: 17.0000 g | PACK | Freq: Every day | ORAL | Status: DC | PRN

## 2024-08-01 MED ORDER — MAGNESIUM CITRATE PO SOLN: 1.0000 | Freq: Once | ORAL | Status: DC

## 2024-08-01 MED ORDER — LORAZEPAM 2 MG/ML IJ SOLN
0.5000 mg | Freq: Once | INTRAMUSCULAR | Status: AC
  Administered 2024-08-01: 0.5 mg via INTRAVENOUS
  Filled 2024-08-01: qty 1

## 2024-08-01 MED ORDER — POTASSIUM CHLORIDE IN NACL 40-0.9 MEQ/L-% IV SOLN
INTRAVENOUS | Status: AC
  Filled 2024-08-01: qty 1000

## 2024-08-01 MED ORDER — ONDANSETRON HCL 4 MG/2ML IJ SOLN
4.0000 mg | Freq: Once | INTRAMUSCULAR | Status: AC
  Administered 2024-08-01: 4 mg via INTRAVENOUS
  Filled 2024-08-01: qty 2

## 2024-08-01 MED ORDER — LACTATED RINGERS IV BOLUS
1000.0000 mL | Freq: Once | INTRAVENOUS | Status: AC
  Administered 2024-08-01: 1000 mL via INTRAVENOUS

## 2024-08-01 MED ORDER — METOCLOPRAMIDE HCL 5 MG/ML IJ SOLN
5.0000 mg | Freq: Once | INTRAMUSCULAR | Status: AC
  Administered 2024-08-01: 5 mg via INTRAVENOUS
  Filled 2024-08-01: qty 2

## 2024-08-01 MED ORDER — MAGNESIUM OXIDE -MG SUPPLEMENT 400 (240 MG) MG PO TABS
800.0000 mg | ORAL_TABLET | Freq: Once | ORAL | Status: DC
  Filled 2024-08-01 (×2): qty 2

## 2024-08-01 MED ORDER — ENOXAPARIN SODIUM 40 MG/0.4ML IJ SOSY
40.0000 mg | PREFILLED_SYRINGE | INTRAMUSCULAR | Status: DC
  Administered 2024-08-01: 40 mg via SUBCUTANEOUS
  Filled 2024-08-01: qty 0.4

## 2024-08-01 MED ORDER — PROCHLORPERAZINE EDISYLATE 10 MG/2ML IJ SOLN
5.0000 mg | INTRAMUSCULAR | Status: DC | PRN
  Administered 2024-08-01 – 2024-08-02 (×3): 5 mg via INTRAVENOUS
  Filled 2024-08-01 (×3): qty 2

## 2024-08-01 MED ORDER — IOHEXOL 350 MG/ML SOLN
100.0000 mL | Freq: Once | INTRAVENOUS | Status: AC | PRN
  Administered 2024-08-01: 100 mL via INTRAVENOUS

## 2024-08-01 MED ORDER — LEVOTHYROXINE SODIUM 25 MCG PO TABS
25.0000 ug | ORAL_TABLET | Freq: Every day | ORAL | Status: DC
  Administered 2024-08-01 – 2024-08-02 (×2): 25 ug via ORAL
  Filled 2024-08-01 (×2): qty 1

## 2024-08-01 MED ORDER — BUSPIRONE HCL 5 MG PO TABS
10.0000 mg | ORAL_TABLET | Freq: Two times a day (BID) | ORAL | Status: DC
  Administered 2024-08-01 – 2024-08-02 (×2): 10 mg via ORAL
  Filled 2024-08-01 (×2): qty 2

## 2024-08-01 MED ORDER — FENTANYL CITRATE PF 50 MCG/ML IJ SOSY
25.0000 ug | PREFILLED_SYRINGE | INTRAMUSCULAR | Status: DC | PRN
  Administered 2024-08-01 – 2024-08-02 (×2): 25 ug via INTRAVENOUS
  Filled 2024-08-01 (×2): qty 1

## 2024-08-01 NOTE — TOC Initial Note (Signed)
 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 MOON completed. TOC will continue to follow.                   Expected Discharge Plan: Home/Self Care Barriers to Discharge: Continued Medical Work up   Patient Goals and CMS Choice Patient states their goals for this hospitalization and ongoing recovery are:: return home          Expected Discharge Plan and Services       Living arrangements for the past 2 months: Single Family Home                                      Prior Living Arrangements/Services Living arrangements for the past 2 months: Single Family Home Lives with:: Spouse Patient language and need for interpreter reviewed:: Yes Do you feel safe going back to the place where you live?: Yes      Need for Family Participation in Patient Care: Yes (Comment) Care giver support system in place?: Yes (comment)   Criminal Activity/Legal Involvement Pertinent to Current Situation/Hospitalization: No - Comment as needed  Activities of Daily Living   ADL Screening (condition at time of admission) Independently performs ADLs?: No Does the patient have a NEW difficulty with bathing/dressing/toileting/self-feeding that is expected to last >3 days?: No Does the patient have a NEW difficulty with getting in/out of bed, walking, or climbing stairs that is expected to last >3 days?: No Does the patient have a NEW difficulty with communication that is expected to last >3 days?:  No Is the patient deaf or have difficulty hearing?: No Does the patient have difficulty seeing, even when wearing glasses/contacts?: No Does the patient have difficulty concentrating, remembering, or making decisions?: No  Permission Sought/Granted                  Emotional Assessment Appearance:: Appears stated age Attitude/Demeanor/Rapport: Engaged Affect (typically observed): Accepting Orientation: : Oriented to Self, Oriented to Place, Oriented to  Time, Oriented to Situation Alcohol / Substance Use: Not Applicable Psych Involvement: No (comment)  Admission diagnosis:  Abdominal pain [R10.9] Patient Active Problem List   Diagnosis Date Noted   Abdominal pain 08/01/2024   Acute sinusitis 08/01/2024   Coronary atherosclerosis 08/01/2024   Vertigo 03/01/2024   Myalgia 10/15/2023   Tension-type headache, not intractable 04/24/2023   Cervical radicular pain 04/03/2023   Chronic back pain 01/07/2023   Sacroiliac dysfunction 09/22/2022   Lumbosacral spondylosis with radiculopathy 07/18/2022   Multinodular goiter 01/30/2021   GAD (generalized anxiety disorder) 09/25/2020   Hypothyroidism (acquired) 07/20/2018   PCP:  Carolee Fallow, MD Pharmacy:   [facility] [address], [place] - [address] [place] [address] Phone: [phone] Fax: [phone]     Social Drivers of Health (SDOH) Social History: SDOH Screenings   Food Insecurity: No Food Insecurity (08/01/2024)  Housing: Low Risk  (  08/01/2024)  Transportation Needs: No Transportation Needs (08/01/2024)  Utilities: Not At Risk (08/01/2024)  Social Connections: Moderately Isolated (08/01/2024)  Tobacco Use: Low Risk  (08/01/2024)   SDOH Interventions:     Readmission Risk Interventions     No data to display

## 2024-08-01 NOTE — Care Management Obs Status (Signed)
 MEDICARE OBSERVATION STATUS NOTIFICATION   Patient Details  Name: Christy Evans MRN: [id number] Date of Birth: [birth year]   Medicare Observation Status Notification Given:  Yes    [name], RN 08/01/2024, 1:55 PM

## 2024-08-01 NOTE — Plan of Care (Signed)
   Problem: Education: Goal: Knowledge of General Education information will improve Description: Including pain rating scale, medication(s)/side effects and non-pharmacologic comfort measures Outcome: Progressing   Problem: Activity: Goal: Risk for activity intolerance will decrease Outcome: Progressing   Problem: Coping: Goal: Level of anxiety will decrease Outcome: Progressing   Problem: Elimination: Goal: Will not experience complications related to bowel motility Outcome: Progressing

## 2024-08-01 NOTE — ED Notes (Signed)
 Pt provided with water and cranberry juice for fluid challenge

## 2024-08-01 NOTE — Progress Notes (Signed)
 Critical Result:  Lactic Acid: 2.2  Celinda, MD paged. Awaiting orders.

## 2024-08-01 NOTE — ED Triage Notes (Signed)
 Patient c/o LLQ abdominal pain x 2 days. Patient report worsening abdominal pain this morning. Patient was seen 2 days ago with same complain. Patient report she tried miralax and fleet enema without relief. Patient report nausea, denies vomiting.

## 2024-08-01 NOTE — ED Provider Notes (Signed)
 [place] EMERGENCY DEPARTMENT AT [facility] Provider Note   CSN: [id number] Arrival date & time: 08/01/24  0404     Patient presents with: Abdominal Pain   Christy Evans is a 73 y.o. female.   Patient very anxious.  She has a history of chronic back pain for which she receives injections and takes Percocet occasionally, hypothyroidism here with left-sided lower abdominal pain.  Pain is constant, progressively worsening, worse with palpation and movement.  She was seen for similar 2 days ago in the ED and had a reassuring CT scan that showed just some constipation.  She does have Percocet for her back pain but reports she only takes this very occasionally and does not normally have constipation.  She reports the pain is much worse than what it was when she was seen 2 days ago.  It became very severe this morning prompting her to return to the ED. Both she and her sister are quite concerned that something serious is going on. She has been dealing with this pain on and off for the better part of 1 month but is progressively worsening.  She saw her gastroenterologist last week who referred her for outpatient CT scan She did not know what was causing her pain but did not feel that she needed a colonoscopy.SABRA  She went to the ED 2 days ago and was told she was constipated.  She believes her last bowel movement was this morning but quite small and soft.  Normally she goes every day but has not had a bowel movement for several days prior to today.  Did have 1 episode of vomiting today.  No fever.  No pain with urination or blood in the urine.  No chest pain or shortness of breath.  No bowel or bladder incontinence.  Her low back pain is at baseline with no new weakness, numbness or tingling.  Previous cholecystectomy as well as tubal ligation.  Still has appendix and ovaries. States her husband has Parkinson's disease and can't see her sick so she been living with her sister and having difficulty  getting to her regular doctors in [place].   The history is provided by the patient and a relative.  Abdominal Pain Associated symptoms: constipation, nausea and vomiting   Associated symptoms: no cough, no dysuria, no fever, no hematuria and no shortness of breath        Prior to Admission medications   Medication Sig Start Date End Date Taking? Authorizing Provider  acidophilus (RISAQUAD) CAPS capsule Take by mouth daily.    [provider]  B Complex Vitamins (VITAMIN B COMPLEX PO) Take 1 tablet by mouth daily.    [provider]  cefdinir (OMNICEF) 300 MG capsule Take 300 mg by mouth daily. 08/23/18   [provider]  cholecalciferol (VITAMIN D) 1000 UNITS tablet Take 1,000 Units by mouth daily.    [provider]  Coenzyme Q10 (CO Q-10) 200 MG CAPS Take 200 mg by mouth daily.    [provider]  DOCUSATE SODIUM PO Take 1 capsule by mouth daily.    [provider]  EVENING PRIMROSE OIL PO Take 1 capsule by mouth daily.     [provider]  levothyroxine (SYNTHROID, LEVOTHROID) 25 MCG tablet Take 25 mcg by mouth daily. 07/20/18 10/18/18  [provider]  naproxen sodium (ALEVE) 220 MG tablet Take 220 mg by mouth daily as needed (headache).    [provider]  OVER THE COUNTER MEDICATION  Hpf cholestate.    [provider]  Polyethyl Glycol-Propyl Glycol (SYSTANE) 0.4-0.3 % GEL ophthalmic gel Place 1 application into both eyes 3 (three) times daily as needed (dry eyes).    [provider]    Allergies: Acetaminophen , Influenza virus vaccine, Latex, Nitrofurantoin macrocrystal, and Tape    Review of Systems  Constitutional:  Positive for activity change and appetite change. Negative for fever.  HENT:  Negative for congestion and rhinorrhea.   Respiratory:  Negative for cough, chest tightness and shortness of breath.   Gastrointestinal:  Positive for abdominal pain, constipation, nausea  and vomiting.  Genitourinary:  Negative for dysuria and hematuria.  Musculoskeletal:  Positive for back pain. Negative for arthralgias and myalgias.  Skin:  Negative for rash.  Neurological:  Negative for dizziness, weakness and headaches.   all other systems are negative except as noted in the HPI and PMH.    Updated Vital Signs BP (!) 184/94   Pulse (!) 109   Temp 97.7 F (36.5 C)   Resp 19   SpO2 99%   Physical Exam Vitals and nursing note reviewed.  Constitutional:      General: She is not in acute distress.    Appearance: She is well-developed.     Comments: Tearful, anxious  HENT:     Head: Normocephalic and atraumatic.     Mouth/Throat:     Pharynx: No oropharyngeal exudate.  Eyes:     Conjunctiva/sclera: Conjunctivae normal.     Pupils: Pupils are equal, round, and reactive to light.  Neck:     Comments: No meningismus. Cardiovascular:     Rate and Rhythm: Normal rate and regular rhythm.     Heart sounds: Normal heart sounds. No murmur heard. Pulmonary:     Effort: Pulmonary effort is normal. No respiratory distress.     Breath sounds: Normal breath sounds.  Abdominal:     Palpations: Abdomen is soft.     Tenderness: There is abdominal tenderness. There is guarding. There is no rebound.     Comments: Left lower quadrant tenderness with voluntary guarding.  Equal femoral, DP and PT pulses.  Genitourinary:    Comments: Chaperone present, Tree surgeon.  No fecal impaction.  No gross blood. Musculoskeletal:        General: No tenderness. Normal range of motion.     Cervical back: Normal range of motion and neck supple.  Skin:    General: Skin is warm.  Neurological:     Mental Status: She is alert and oriented to person, place, and time.     Cranial Nerves: No cranial nerve deficit.     Motor: No abnormal muscle tone.     Coordination: Coordination normal.     Comments:  5/5 strength throughout. CN 2-12 intact.Equal grip strength.   Psychiatric:        Behavior:  Behavior normal.     (all labs ordered are listed, but only abnormal results are displayed) Labs Reviewed  COMPREHENSIVE METABOLIC PANEL WITH GFR - Abnormal; Notable for the following components:      Result Value   Sodium 134 (*)    Potassium 3.4 (*)    CO2 19 (*)    Glucose, Bld 120 (*)    Alkaline Phosphatase 128 (*)    Anion gap 16 (*)    All other components within normal limits  CBC - Abnormal; Notable for the following components:   Hemoglobin 15.1 (*)    All other components within normal limits  URINALYSIS, ROUTINE W REFLEX MICROSCOPIC - Abnormal; Notable for the following components:   Specific Gravity, Urine 1.004 (*)    Ketones, ur 5 (*)    All other components within normal limits  LIPASE, BLOOD  POC OCCULT BLOOD, ED  TROPONIN T, HIGH SENSITIVITY    EKG: EKG Interpretation Date/Time:  Monday August 01 2024 04:14:07 EDT Ventricular Rate:  106 PR Interval:  175 QRS Duration:  80 QT Interval:  344 QTC Calculation: 457 R Axis:   130  Text Interpretation: Right and left arm electrode reversal, interpretation assumes no reversal Sinus tachycardia Consider right atrial enlargement Right axis deviation Nonspecific T abnormalities, lateral leads No previous ECGs available Nonspecific ST abnormality Confirmed by Carita Senior [phone]) on 08/01/2024 4:40:03 AM  Radiology: CT Angio Chest/Abd/Pel for Dissection W and/or Wo Contrast Result Date: 08/01/2024 CLINICAL DATA:  Flank pain, back pain, left lower quadrant pain. Acute aortic syndrome suspected. EXAM: CT ANGIOGRAPHY CHEST, ABDOMEN AND PELVIS TECHNIQUE: Noncontrast CT of the chest was initially obtained. Multidetector CT imaging through the chest, abdomen and pelvis was performed using the standard protocol during bolus administration of intravenous contrast. Multiplanar reconstructed images and MIPs were obtained and reviewed to evaluate the vascular anatomy. RADIATION DOSE REDUCTION: This exam was performed according  to the departmental dose-optimization program which includes automated exposure control, adjustment of the mA and/or kV according to patient size and/or use of iterative reconstruction technique. CONTRAST:  OMNIPAQUE  IOHEXOL  350 MG/ML SOLN COMPARISON:  No prior chest CT. Last chest x-ray was AP Lat chest 07/30/2024. Comparison is made with CT abdomen and pelvis with contrast 07/30/2024 and 09/09/2018. FINDINGS: CTA CHEST FINDINGS Cardiovascular: There are trace single-vessel calcifications in the proximal LAD coronary artery. The cardiac size is normal. There is no pericardial effusion. Pulmonary arteries are well opacified free of thrombus through the segmental level. The pulmonary veins are nondistended. Thoracic aorta is slightly tortuous but without appreciable plaques. There is no aortic or great vessel aneurysm, stenosis or dissection. Normal variant 2 vessel aortic arch is seen with a brachiobicarotid trunk. Mediastinum/Nodes: No enlarged mediastinal, hilar, or axillary lymph nodes. Thyroid gland, trachea, and esophagus demonstrate no significant findings. Lungs/Pleura: Lungs are clear. No pleural effusion or pneumothorax. Musculoskeletal: Mild thoracic kyphodextroscoliosis and degenerative changes. Osteopenia. Postsurgical change right humeral head. No acute or significant osseous findings.  Unremarkable chest wall. Review of the MIP images confirms the above findings. CTA ABDOMEN AND PELVIS FINDINGS VASCULAR Aorta: Slightly tortuous. Otherwise normal. No appreciable plaque disease. Celiac: Patent without evidence of aneurysm, dissection, vasculitis or significant stenosis. There are nonstenosing ostial calcifications along the superior vessel wall. SMA: Patent without evidence of aneurysm, dissection, vasculitis or significant stenosis. Renals: Both are single.  Both are normal. IMA: Normal. Inflow: Patent without evidence of aneurysm, dissection, vasculitis or significant stenosis. There are mild  nonstenosing calcific plaques in both proximal internal iliac arteries. The common iliac and external iliac arteries bilaterally plaque free. The proximal outflow vessels are normal. Veins: Unopacified and not evaluated, but were normal 2 days ago. Review of the MIP images confirms the above findings. NON-VASCULAR Hepatobiliary: No focal liver abnormality is seen. Status post cholecystectomy. No biliary dilatation. Pancreas: No abnormality. Spleen: No abnormality. Adrenals/Urinary Tract: No abnormality. Stomach/Bowel: No dilatation or wall thickening including the appendix. Moderate retained stool ascending and transverse colon. Descending and sigmoid diverticulosis without evidence of acute diverticulitis. Lymphatic: No adenopathy. Reproductive: Uterus and bilateral adnexa are unremarkable. Other: None. Musculoskeletal: Lumbar levoscoliosis and degenerative changes. Osteopenia. No acute  or other significant osseous findings. Review of the MIP images confirms the above findings. IMPRESSION: 1. No acute CT or CTA findings in the chest, abdomen or pelvis. 2. Slightly tortuous aorta without appreciable plaques. No aneurysm, stenosis or dissection. 3. Trace single-vessel calcifications proximal LAD coronary artery. 4. Constipation and diverticulosis. 5. Osteopenia, kyphoscoliosis and degenerative change. Electronically Signed   By: Francis Quam M.D.   On: 08/01/2024 06:11   US  PELVIC COMPLETE W TRANSVAGINAL AND TORSION R/O Result Date: 08/01/2024 CLINICAL DATA:  [id number]. 73 year old female with pelvic pain left lower quadrant x2 days. EXAM: TRANSABDOMINAL AND TRANSVAGINAL ULTRASOUND OF PELVIS DOPPLER ULTRASOUND OF OVARIES TECHNIQUE: Both transabdominal and transvaginal ultrasound examinations of the pelvis were performed. Transabdominal technique was performed for global imaging of the pelvis including uterus, ovaries, adnexal regions, and pelvic cul-de-sac. It was necessary to proceed with endovaginal exam  following the transabdominal exam to visualize the adnexal areas and endometrium better. Color and duplex Doppler ultrasound was utilized to evaluate blood flow to the ovaries. COMPARISON:  Last pelvic ultrasound was 09/09/2018, last CT was 2 days ago 07/30/2024 with contrast. FINDINGS: Uterus Measurements: Anteverted measuring 4.5 x 2.3 x 4.2 cm = volume: 22.7 mL. No fibroids or other mass visualized. Unremarkable cervix apart from subcentimeter simple nabothian cysts. Endometrium Thickness: 2.7 mm, within normal limits for atrophic endometrium. No focal abnormality visualized along the endometrial stripe, but there is a small volume of nonspecific anechoic fluid within the uterine cavity. There was a small amount of fluid previously but the amount is increased since 2019. Right ovary Not seen, but was unremarkable on the CT 2 days ago. Left ovary Measurements: 1.2 x 0.7 x 1.1 cm = volume: 0.5 mL. Normal appearance/no adnexal mass. This ovary was only visualized transvaginally Pulsed Doppler evaluation of the left ovary demonstrates normal low-resistance arterial and venous waveforms. Other findings No abnormal free fluid. IMPRESSION: 1. Small volume of nonspecific anechoic fluid within the uterine cavity. There was a small amount of fluid previously but the amount is increased since 2019. 2. Normal thickness of the atrophic endometrium. 3. Negative left ovary. 4. Nonvisualization of the right ovary, but it was unremarkable on the CT 2 days ago. Electronically Signed   By: Francis Quam M.D.   On: 08/01/2024 05:47   CT ABDOMEN PELVIS W CONTRAST Result Date: 07/30/2024 CLINICAL DATA:  Left lower back pain radiating to right abdominal area. EXAM: CT ABDOMEN AND PELVIS WITH CONTRAST TECHNIQUE: Multidetector CT imaging of the abdomen and pelvis was performed using the standard protocol following bolus administration of intravenous contrast. RADIATION DOSE REDUCTION: This exam was performed according to the  departmental dose-optimization program which includes automated exposure control, adjustment of the mA and/or kV according to patient size and/or use of iterative reconstruction technique. CONTRAST:  OMNIPAQUE  IOHEXOL  300 MG/ML  SOLN COMPARISON:  09/09/2018. FINDINGS: Lower chest: No acute findings. Heart size normal. No pericardial effusion. No pleural effusion. Distal esophagus is grossly unremarkable. Hepatobiliary: Liver is unremarkable. Cholecystectomy. No biliary ductal dilatation. Pancreas: Negative. Spleen: Negative. Adrenals/Urinary Tract: Adrenal glands and kidneys are unremarkable. Ureters are decompressed. Bladder is relatively low in volume. Stomach/Bowel: Stomach, small bowel, appendix and colon are unremarkable. Incidental note is made of a duodenal diverticulum. Moderate stool burden. Vascular/Lymphatic: Vascular structures are unremarkable. No pathologically enlarged lymph nodes. Reproductive: Uterus is visualized.  No adnexal mass. Other: No free fluid.  Mesenteries and peritoneum are unremarkable. Musculoskeletal: Degenerative changes in the spine. Levoconvex scoliosis. IMPRESSION: 1. No acute findings. 2.  Moderate stool burden. Electronically Signed   By: Newell Eke M.D.   On: 07/30/2024 15:35   DG Chest 2 View Result Date: 07/30/2024 CLINICAL DATA:  cough EXAM: CHEST - 2 VIEW COMPARISON:  Chest x-ray 09/07/2018 FINDINGS: The heart and mediastinal contours are within normal limits. No focal consolidation. No pulmonary edema. No pleural effusion. No pneumothorax. No acute osseous abnormality. Right shoulder rotator cuff anchor suture noted. IMPRESSION: No active cardiopulmonary disease. Electronically Signed   By: Morgane  Naveau M.D.   On: 07/30/2024 11:59     Procedures   Medications Ordered in the ED  lactated ringers bolus 1,000 mL (has no administration in time range)  ondansetron  (ZOFRAN ) injection 4 mg (4 mg Intravenous Given 08/01/24 0438)  fentaNYL (SUBLIMAZE)  injection 50 mcg (50 mcg Intravenous Given 08/01/24 0439)  LORazepam (ATIVAN) injection 0.5 mg (0.5 mg Intravenous Given 08/01/24 0439)                                    Medical Decision Making Amount and/or Complexity of Data Reviewed Labs: ordered. Decision-making details documented in ED Course. Radiology: ordered and independent interpretation performed. Decision-making details documented in ED Course. ECG/medicine tests: ordered and independent interpretation performed. Decision-making details documented in ED Course.  Risk Prescription drug management. Decision regarding hospitalization.   Left-sided abdominal pain progressively worsening x 1 month.  She is very anxious, tearful.  Her vital signs are stable she is hypertensive and tachycardic likely due to her anxiety.  Abdomen is tender in the left lower quadrant but does not have any peritoneal signs.  CT scan from 2 days ago was reviewed and shows just evidence of some constipation without bowel obstruction.  No aortic aneurysm.  Patient given pain nausea medication as well as some Ativan due to her degree of anxiety.  Workup 2 days ago was very reassuring with CT scan showing some constipation.  Given her severe left lower quadrant pain we will proceed with pelvic ultrasound to further evaluate for ovarian pathology though low suspicion for ovarian torsion given her age.  EKG is sinus rhythm.  There is some mild ST depressions inferiorly and laterally without comparison.  It appears EKG was ordered 2 days ago but not performed. Troponin is negative.  She denies any current chest pain. Labs are stable including normal urinalysis.  Stable LFTs, lipase and creatinine.  No significant leukocytosis.  Mild anion gap acidosis.  Ultrasound shows normal left ovary.  Nonvisualization of right ovary.  There is nonspecific fluid in her uterus which has been present previously but slightly increased.  CTA negative for aortic aneurysm or  dissection.  Negative for pulmonary embolism though negative for such. No other acute findings other than constipation.  Attempted to reassure patient of reassuring results.  She is extremely anxious and very worried that something serious is going on.  Discussed with patient and sister at bedside that CT scans have been reassuring without evidence of acute surgical problem.  Doubt the fluid in her uterus is causing her severe abdominal pain.  Mesenteric ischemia was ruled out by CTA today which shows no evidence of such.  Lactate minimally elevated at 2.2 Constipation could be contributing to her symptoms but she is very concerned that something else is going on and says she is not able to go home with these symptoms.  She is also concerned that her doctors are in [place] and she does  cannot get into see them.  Therefore we will plan observation admission for pain control and specialty evaluation as needed.  No evidence of acute surgical problem at this time.  Patient's symptoms will be treated and she will be hydrated.  May benefit from bowel regimen with possible enema and GoLytely but no evidence of fecal impaction on exam.  Given her ongoing pain and concern case discussed with Dr. Shona.    Final diagnoses:  Intractable abdominal pain    ED Discharge Orders     None          Nolene Rocks, Garnette, MD 08/01/24 [phone]

## 2024-08-01 NOTE — ED Notes (Signed)
 US  at bedside.

## 2024-08-01 NOTE — H&P (Signed)
 History and Physical    Patient: Christy Evans FMW:[id number] DOB: [birth year] DOA: 08/01/2024 DOS: the patient was seen and examined on 08/01/2024 PCP: Carolee Fallow, MD  Patient coming from: Home  Chief Complaint:  Chief Complaint  Patient presents with   Abdominal Pain   HPI: Christy Evans is a 73 y.o. female with medical history significant of sinusitis, chronic back pain, cervical radicular pain, lumbosacral spondylosis with radiculopathy, multinodular goiter, acquired hypothyroidism, generalized anxiety disorder, GERD, tension headaches, vertigo presented with left lower quadrant abdominal pain associated with nausea for the past 2 days and constipation since 4 days ago.  She has increased her protein intake recently.  She takes stool softeners occasionally at bedtime.  No emesis, diarrhea, melena or hematochezia.  No flank pain, dysuria, frequency or hematuria.  She denied fever, chills, rhinorrhea, sore throat, wheezing or hemoptysis.  No chest pain, palpitations, diaphoresis, PND, orthopnea or pitting edema of the lower extremities.   No polyuria, polydipsia, polyphagia or blurred vision.   Lab work: Urinalysis shows specific gravity 1.004 and ketones of 5 mg/dL. Fecal occult blood was negative. CBC showed a white count 7.7, hemoglobin 15.1 g/dL platelets 704.  Normal lipase and troponin x 2.  Lactic acid 2.2 and 1.8 mmol/L.  CMP shows sodium 134, potassium 3.4, chloride 99 and CO2 19 mmol/L with an anion gap of 16.  Glucose 128 mg/dL and alkaline phosphatase 128 units/L.  Calcium, renal function and the rest of the LFTs were normal.  Imaging: Pelvic ultrasound with small volume of nonspecific anechoic fluid within the uterine cavity.  There is a mild increase in the amount since 2019.  Normal thickness of the atrophic endometrium.  Negative left ovary.  Nonvisualization of the right ovary, but he was unremarkable on the CT abdomen/pelvic done from 2 days ago.  CTA chest/abdomen/pelvis with no  acute findings.  There is slightly tortuous aorta without appreciable plaque.  Trace single-vessel calcification in the proximal LAD coronary artery.  Constipation and diverticulosis.  Osteopenia, kyphoscoliosis and degenerative change.   ED course: Initial vital signs were temperature 97.7 F, pulse 109, respiration 19, BP 184/94 mmHg and O2 sat 99% on room air.  The patient received fentanyl 50 mcg IVP x 2, ketorolac  15 mg IVP x 1, lorazepam 0.5 mg IVP x 2, Zofran  4 mg IVP x 1, 1000 mL of LR bolus and add 300 mL smog enema.  I added metoclopramide 5 mg IVP x 1, oral potassium and magnesium supplementation.  Review of Systems: As mentioned in the history of present illness. All other systems reviewed and are negative. History reviewed. No pertinent past medical history. Past Surgical History:  Procedure Laterality Date   CHOLECYSTECTOMY     KNEE ARTHROSCOPY     TUBAL LIGATION     Social History:  reports that she has never smoked. She has never used smokeless tobacco. She reports that she does not drink alcohol. No history on file for drug use.  Allergies  Allergen Reactions   Acetaminophen  Anaphylaxis    Throat swelling with tylenol  products   Influenza Virus Vaccine Anaphylaxis   Latex Anaphylaxis    blister   Nitrofurantoin Macrocrystal Rash   Tape Other (See Comments)    blisters    History reviewed. No pertinent family history.  Prior to Admission medications   Medication Sig Start Date End Date Taking? Authorizing Provider  acidophilus (RISAQUAD) CAPS capsule Take by mouth daily.    [provider]  B Complex Vitamins (VITAMIN B COMPLEX  PO) Take 1 tablet by mouth daily.    [provider]  cefdinir (OMNICEF) 300 MG capsule Take 300 mg by mouth daily. 08/23/18   [provider]  cholecalciferol (VITAMIN D) 1000 UNITS tablet Take 1,000 Units by mouth daily.    [provider]  Coenzyme Q10 (CO Q-10) 200 MG CAPS Take 200 mg by mouth daily.     [provider]  DOCUSATE SODIUM PO Take 1 capsule by mouth daily.    [provider]  EVENING PRIMROSE OIL PO Take 1 capsule by mouth daily.     [provider]  levothyroxine (SYNTHROID, LEVOTHROID) 25 MCG tablet Take 25 mcg by mouth daily. 07/20/18 10/18/18  [provider]  naproxen sodium (ALEVE) 220 MG tablet Take 220 mg by mouth daily as needed (headache).    [provider]  OVER THE COUNTER MEDICATION Hpf cholestate.    [provider]  Polyethyl Glycol-Propyl Glycol (SYSTANE) 0.4-0.3 % GEL ophthalmic gel Place 1 application into both eyes 3 (three) times daily as needed (dry eyes).    [provider]    Physical Exam: Vitals:   08/01/24 0553 08/01/24 0600 08/01/24 0630 08/01/24 0700  BP:   139/79 117/70  Pulse: 91 94 86 86  Resp: 14 17 (!) 22 16  Temp:      SpO2: 100% 99% 100% 97%   Physical Exam Vitals and nursing note reviewed.  Constitutional:      General: She is awake. She is not in acute distress.    Appearance: She is normal weight. She is ill-appearing.  HENT:     Head: Normocephalic.     Nose: No rhinorrhea.     Mouth/Throat:     Mouth: Mucous membranes are dry.  Eyes:     General: No scleral icterus.    Pupils: Pupils are equal, round, and reactive to light.  Neck:     Vascular: No JVD.  Cardiovascular:     Rate and Rhythm: Normal rate and regular rhythm.     Heart sounds: S1 normal and S2 normal.  Pulmonary:     Effort: Pulmonary effort is normal.     Breath sounds: Normal breath sounds. No wheezing, rhonchi or rales.  Abdominal:     General: Bowel sounds are normal.     Palpations: Abdomen is soft.     Tenderness: There is abdominal tenderness. There is no right CVA tenderness or left CVA tenderness.     Hernia: No hernia is present.  Musculoskeletal:     Cervical back: Neck supple.     Right lower leg: No edema.     Left lower leg: No edema.  Skin:    General: Skin is warm and dry.   Neurological:     General: No focal deficit present.     Mental Status: She is alert and oriented to person, place, and time.  Psychiatric:        Mood and Affect: Mood normal.        Behavior: Behavior normal. Behavior is cooperative.     Data Reviewed:  Results are pending, will review when available. EKG: Vent. rate 92 BPM PR interval 161 ms QRS duration 80 ms QT/QTcB 387/479 ms P-R-T axes 111 145 119 Right and left arm electrode reversal, interpretation assumes no reversal Sinus rhythm Right axis deviation Abnormal R-wave progression, early transition Nonspecific T abnormalities, lateral leads  Assessment and Plan: Principal Problem:   Abdominal pain associated with nausea In the setting  of:   Constipation Observation/MedSurg. Continue IV fluids. Clear liquid diet. Advance diet as tolerated. Analgesics as needed. Antiemetics as needed. Will check TSH. Follow CBC, CMP in AM.  Active Problems:   GAD (generalized anxiety disorder) Received 2 doses of lorazepam 0.5 mg yesterday. Will use buspirone 10 mg p.o. twice daily. She will follow-up with PCP and/or BH.    Hypothyroidism (acquired) Check TSH. Adjust dosage as needed.    Lumbosacral spondylosis with radiculopathy Analgesics as needed. Follow-up with spine specialist as scheduled.    Coronary atherosclerosis Seems to be minimal. However, EKG showing changes. Will obtain an echocardiogram. Patient to follow-up with PCP and cardiology as OP.    Advance Care Planning:   Code Status: Full Code   Consults:   Family Communication:   Severity of Illness: The appropriate patient status for this patient is OBSERVATION. Observation status is judged to be reasonable and necessary in order to provide the required intensity of service to ensure the patient's safety. The patient's presenting symptoms, physical exam findings, and initial radiographic and laboratory data in the context of their medical  condition is felt to place them at decreased risk for further clinical deterioration. Furthermore, it is anticipated that the patient will be medically stable for discharge from the hospital within 2 midnights of admission.   Author: Alm Dorn Castor, MD 08/01/2024 7:48 AM  For on call review [email or web address].   This document was prepared using Dragon voice recognition software and may contain some unintended transcription errors.

## 2024-08-02 ENCOUNTER — Observation Stay (HOSPITAL_BASED_OUTPATIENT_CLINIC_OR_DEPARTMENT_OTHER)

## 2024-08-02 DIAGNOSIS — R1032 Left lower quadrant pain: Secondary | ICD-10-CM

## 2024-08-02 DIAGNOSIS — K5903 Drug induced constipation: Secondary | ICD-10-CM

## 2024-08-02 DIAGNOSIS — I2510 Atherosclerotic heart disease of native coronary artery without angina pectoris: Secondary | ICD-10-CM

## 2024-08-02 DIAGNOSIS — K5791 Diverticulosis of intestine, part unspecified, without perforation or abscess with bleeding: Secondary | ICD-10-CM | POA: Diagnosis not present

## 2024-08-02 LAB — ECHOCARDIOGRAM COMPLETE
Area-P 1/2: 4.89 cm2
Height: 64 in
P 1/2 time: 397 ms
S' Lateral: 2.2 cm
Weight: 2047.99 [oz_av]

## 2024-08-02 MED ORDER — ONDANSETRON 4 MG PO TBDP: 4.0000 mg | ORAL_TABLET | Freq: Three times a day (TID) | ORAL | 0 refills | Status: AC | PRN

## 2024-08-02 MED ORDER — POLYETHYLENE GLYCOL 3350 17 G PO PACK: 17.0000 g | PACK | Freq: Two times a day (BID) | ORAL | 0 refills | Status: AC

## 2024-08-02 NOTE — Progress Notes (Signed)
 [facility] received a consult to assist Christy Evans with advance directives.  When I came by, she was preparing for discharge.  I provided her with paperwork and she plans to finish it at a later time.

## 2024-08-02 NOTE — Discharge Summary (Signed)
 Physician Discharge Summary  Christy Evans FMW:[id number] DOB: 07-23-1951 DOA: 08/01/2024  PCP: Carolee Fallow, MD  Admit date: 08/01/2024 Discharge date: 08/02/2024  Admitted From: Home Disposition: Home  Recommendations for Outpatient Follow-up:  Follow up with PCP in 1-2 weeks  Home Health: None Equipment/Devices: None  Discharge Condition: Stable CODE STATUS: Full Diet recommendation: Soft diet  Brief/Interim Summary: Christy Evans is a 73 y.o. female with medical history significant of sinusitis, chronic back pain, cervical radicular pain, lumbosacral spondylosis with radiculopathy, multinodular goiter, acquired hypothyroidism, generalized anxiety disorder, GERD, tension headaches, vertigo presented with left lower quadrant abdominal pain associated with nausea for the past 2 days and constipation since 4 days ago.   Patient admitted as above with profound constipation and stool burden noted on imaging with worsening constipation at home despite over-the-counter medications and previous ED visit.  At this time patient is now passing stool without difficulty.  Discussed need for improved medication regimen at home including MiraLAX, Senokot/Dulcolax as well as increased fluid intake.  We also discussed need for possible reevaluation with pain clinic to de-escalate narcotics as appropriate given this is also likely playing a large role in her constipation.  At this time however given her resolution of symptoms multiple bowel movements without further difficulty she is otherwise stable and agreeable for discharge home.  Of note patient also had minimally elevated TSH, would reevaluate with eluate with PCP and endocrinology for titration for titration of her levothyroxine.  Discharge Diagnoses:  Principal Problem:   Abdominal pain Active Problems:   GAD (generalized anxiety disorder)   Hypothyroidism (acquired)   Lumbosacral spondylosis with radiculopathy   Coronary atherosclerosis    Constipation  Acute intractable abdominal pain in the setting of constipation  Lactic acidosis Resolved, continue supportive care as above  GAD (generalized anxiety disorder) Resume home medications, no changes otherwise  Hypothyroidism (acquired) Minimally elevated TSH, defer to PCP and outpatient endocrinology for adjustment in medications   Lumbosacral spondylosis with radiculopathy Continue home regimen, discussed follow-up with outpatient pain clinic   Coronary atherosclerosis Echo without wall motion abnormality, follow-up with outpatient cardiology for further insight and recommendations  Discharge Instructions  Discharge Instructions     Call MD for:  difficulty breathing, headache or visual disturbances   Complete by: As directed    Call MD for:  extreme fatigue   Complete by: As directed    Call MD for:  hives   Complete by: As directed    Call MD for:  persistant dizziness or light-headedness   Complete by: As directed    Call MD for:  persistant nausea and vomiting   Complete by: As directed    Call MD for:  severe uncontrolled pain   Complete by: As directed    Call MD for:  temperature >100.4   Complete by: As directed    Diet - low sodium heart healthy   Complete by: As directed    Increase activity slowly   Complete by: As directed       Allergies as of 08/02/2024       Reactions   Influenza Vac Split Quad Anaphylaxis   Influenza Virus Vaccine Anaphylaxis, Other (See Comments)   Including Flu Vac Vc7984-83(63 Mo,Up- PF)   Latex Anaphylaxis, Dermatitis, Other (See Comments)   Blisters, too   Nitrofurantoin Macrocrystal Rash   Fluconazole Nausea And Vomiting, Other (See Comments)   GI intolerance    Metronidazole  Nausea And Vomiting, Other (See Comments)   GI intolerance- is tolerated topically,  just not orally   Nsaids Other (See Comments)   These cause gastritis   Silicone Dermatitis   Tape Other (See Comments)   Blisters - ONLY PAPER TAPE  IS OK   Wound Dressing Adhesive Dermatitis   Paper tape is OK   Gabapentin Anxiety, Other (See Comments)   Causes the patient to feel jittery        Medication List     TAKE these medications    ALPRAZolam 0.5 MG tablet Commonly known as: XANAX Take 0.5 mg by mouth daily as needed for anxiety or sleep.   Co Q-10 200 MG Caps Take 200 mg by mouth daily.   docusate sodium 100 MG capsule Commonly known as: COLACE Take 200 mg by mouth at bedtime.   fluocinonide cream 0.05 % Commonly known as: LIDEX Apply 1 Application topically 2 (two) times daily as needed (for itching- affected finger - when not using nystatin cream).   HYDROcodone-acetaminophen  5-325 MG tablet Commonly known as: NORCO/VICODIN Take 1 tablet by mouth every 6 (six) hours as needed for severe pain (pain score 7-10).   hyoscyamine 0.125 MG SL tablet Commonly known as: LEVSIN SL Place 0.125 mg under the tongue every 4 (four) hours as needed for cramping.   Ibuprofen 200 MG Caps Take 400 mg by mouth every 8 (eight) hours as needed (for pain or headaches).   levothyroxine 25 MCG tablet Commonly known as: SYNTHROID Take 25 mcg by mouth daily before breakfast.   metroNIDAZOLE  0.75 % cream Commonly known as: METROCREAM  Apply 1 Application topically See admin instructions. Apply to affected areas of the face at bedtime   NON FORMULARY Place 1 drop into both eyes See admin instructions. Optase MGD advanced dry eye drops PF  - Instill 1 drop into both eyes in the morning and bedtime   nystatin cream Commonly known as: MYCOSTATIN Apply 1 Application topically 2 (two) times daily as needed for dry skin (affected finger- when not using fluocinonide cream).   ondansetron  4 MG disintegrating tablet Commonly known as: ZOFRAN -ODT Take 1 tablet (4 mg total) by mouth every 8 (eight) hours as needed for nausea or vomiting (dissolve orally).   polyethylene glycol 17 g packet Commonly known as: MIRALAX /  GLYCOLAX Take 17 g by mouth 2 (two) times daily.   VITAMIN B COMPLEX PO Take 1 tablet by mouth daily.   VITAMIN K2-VITAMIN D3 PO Take 1 capsule by mouth daily.        Allergies  Allergen Reactions   Influenza Vac Split Quad Anaphylaxis   Influenza Virus Vaccine Anaphylaxis and Other (See Comments)    Including Flu Vac [id number](63 Mo,Up- PF)   Latex Anaphylaxis, Dermatitis and Other (See Comments)    Blisters, too   Nitrofurantoin Macrocrystal Rash   Fluconazole Nausea And Vomiting and Other (See Comments)    GI intolerance      Metronidazole  Nausea And Vomiting and Other (See Comments)    GI intolerance- is tolerated topically, just not orally   Nsaids Other (See Comments)    These cause gastritis   Silicone Dermatitis   Tape Other (See Comments)    Blisters - ONLY PAPER TAPE IS OK   Wound Dressing Adhesive Dermatitis    Paper tape is OK   Gabapentin Anxiety and Other (See Comments)    Causes the patient to feel jittery    Consultations: None  Procedures/Studies: ECHOCARDIOGRAM COMPLETE Result Date: 08/02/2024    ECHOCARDIOGRAM REPORT   Patient Name:   Christy Evans  Date of Exam: 08/02/2024 Medical Rec #:  [id number]   Height:       64.0 in Accession #:    [id number]  Weight:       128.0 lb Date of Birth:  [birth year]  BSA:          1.618 m Patient Age:    72 years    BP:           134/71 mmHg Patient Gender: F           HR:           103 bpm. Exam Location:  Inpatient Procedure: 2D Echo (Both Spectral and Color Flow Doppler were utilized during            procedure). Indications:    CAD native vessel  History:        Patient has no prior history of Echocardiogram examinations.  Sonographer:    Charmaine Gaskins Referring Phys: [id number] DAVID MANUEL ORTIZ IMPRESSIONS  1. Left ventricular ejection fraction, by estimation, is 60 to 65%. The left ventricle has normal function. The left ventricle has no regional wall motion abnormalities. Left ventricular diastolic parameters were  normal.  2. Right ventricular systolic function is normal. The right ventricular size is normal.  3. The mitral valve is normal in structure. Mild mitral valve regurgitation. No evidence of mitral stenosis.  4. RVSP estimated at 27 mmHg above right atrial pressure.  5. The aortic valve is tricuspid. Aortic valve regurgitation is mild to moderate. No aortic stenosis is present. Aortic regurgitation PHT measures 397 msec.  6. The inferior vena cava is normal in size with greater than 50% respiratory variability, suggesting right atrial pressure of 3 mmHg. FINDINGS  Left Ventricle: Left ventricular ejection fraction, by estimation, is 60 to 65%. The left ventricle has normal function. The left ventricle has no regional wall motion abnormalities. The left ventricular internal cavity size was normal in size. There is  no left ventricular hypertrophy. Left ventricular diastolic parameters were normal. Right Ventricle: RVSP estimated at 27 mmHg above right atrial pressure. The right ventricular size is normal. No increase in right ventricular wall thickness. Right ventricular systolic function is normal. Left Atrium: Left atrial size was normal in size. Right Atrium: Right atrial size was normal in size. Pericardium: There is no evidence of pericardial effusion. Mitral Valve: The mitral valve is normal in structure. Mild mitral valve regurgitation. No evidence of mitral valve stenosis. Tricuspid Valve: RVSP estimated at 27 mmHg above right atrial pressure. The tricuspid valve is normal in structure. Tricuspid valve regurgitation is mild . No evidence of tricuspid stenosis. Aortic Valve: The aortic valve is tricuspid. Aortic valve regurgitation is mild to moderate. Aortic regurgitation PHT measures 397 msec. No aortic stenosis is present. Pulmonic Valve: The pulmonic valve was normal in structure. Pulmonic valve regurgitation is not visualized. No evidence of pulmonic stenosis. Aorta: The aortic root is normal in size and  structure. Venous: The inferior vena cava is normal in size with greater than 50% respiratory variability, suggesting right atrial pressure of 3 mmHg. IAS/Shunts: No atrial level shunt detected by color flow Doppler.  LEFT VENTRICLE PLAX 2D LVIDd:         4.00 cm   Diastology LVIDs:         2.20 cm   LV e' medial:    6.96 cm/s LV PW:         0.70 cm   LV E/e' medial:  12.1 LV IVS:  0.70 cm   LV e' lateral:   9.79 cm/s LVOT diam:     2.00 cm   LV E/e' lateral: 8.6 LVOT Area:     3.14 cm  RIGHT VENTRICLE RV Basal diam:  2.60 cm RV Mid diam:    2.30 cm RV S prime:     14.90 cm/s LEFT ATRIUM             Index        RIGHT ATRIUM           Index LA diam:        2.90 cm 1.79 cm/m   RA Area:     10.10 cm LA Vol (A2C):   27.0 ml 16.68 ml/m  RA Volume:   21.50 ml  13.29 ml/m LA Vol (A4C):   23.1 ml 14.27 ml/m LA Biplane Vol: 25.7 ml 15.88 ml/m  AORTIC VALVE AI PHT:      397 msec  AORTA Ao Root diam: 2.70 cm Ao Asc diam:  3.40 cm MITRAL VALVE                TRICUSPID VALVE MV Area (PHT): 4.89 cm     TR Peak grad:   27.2 mmHg MV Decel Time: 155 msec     TR Vmax:        261.00 cm/s MV E velocity: 84.20 cm/s MV A velocity: 127.00 cm/s  SHUNTS MV E/A ratio:  0.66         Systemic Diam: 2.00 cm Joelle Azobou Tonleu Electronically signed by Joelle Cedars Tonleu Signature Date/Time: 08/02/2024/9:48:56 AM    Final    CT Angio Chest/Abd/Pel for Dissection W and/or Wo Contrast Result Date: 08/01/2024 CLINICAL DATA:  Flank pain, back pain, left lower quadrant pain. Acute aortic syndrome suspected. EXAM: CT ANGIOGRAPHY CHEST, ABDOMEN AND PELVIS TECHNIQUE: Noncontrast CT of the chest was initially obtained. Multidetector CT imaging through the chest, abdomen and pelvis was performed using the standard protocol during bolus administration of intravenous contrast. Multiplanar reconstructed images and MIPs were obtained and reviewed to evaluate the vascular anatomy. RADIATION DOSE REDUCTION: This exam was performed according  to the departmental dose-optimization program which includes automated exposure control, adjustment of the mA and/or kV according to patient size and/or use of iterative reconstruction technique. CONTRAST:  OMNIPAQUE  IOHEXOL  350 MG/ML SOLN COMPARISON:  No prior chest CT. Last chest x-ray was AP Lat chest 07/30/2024. Comparison is made with CT abdomen and pelvis with contrast 07/30/2024 and 09/09/2018. FINDINGS: CTA CHEST FINDINGS Cardiovascular: There are trace single-vessel calcifications in the proximal LAD coronary artery. The cardiac size is normal. There is no pericardial effusion. Pulmonary arteries are well opacified free of thrombus through the segmental level. The pulmonary veins are nondistended. Thoracic aorta is slightly tortuous but without appreciable plaques. There is no aortic or great vessel aneurysm, stenosis or dissection. Normal variant 2 vessel aortic arch is seen with a brachiobicarotid trunk. Mediastinum/Nodes: No enlarged mediastinal, hilar, or axillary lymph nodes. Thyroid gland, trachea, and esophagus demonstrate no significant findings. Lungs/Pleura: Lungs are clear. No pleural effusion or pneumothorax. Musculoskeletal: Mild thoracic kyphodextroscoliosis and degenerative changes. Osteopenia. Postsurgical change right humeral head. No acute or significant osseous findings.  Unremarkable chest wall. Review of the MIP images confirms the above findings. CTA ABDOMEN AND PELVIS FINDINGS VASCULAR Aorta: Slightly tortuous. Otherwise normal. No appreciable plaque disease. Celiac: Patent without evidence of aneurysm, dissection, vasculitis or significant stenosis. There are nonstenosing ostial calcifications along the superior vessel wall. SMA:  Patent without evidence of aneurysm, dissection, vasculitis or significant stenosis. Renals: Both are single.  Both are normal. IMA: Normal. Inflow: Patent without evidence of aneurysm, dissection, vasculitis or significant stenosis. There are mild  nonstenosing calcific plaques in both proximal internal iliac arteries. The common iliac and external iliac arteries bilaterally plaque free. The proximal outflow vessels are normal. Veins: Unopacified and not evaluated, but were normal 2 days ago. Review of the MIP images confirms the above findings. NON-VASCULAR Hepatobiliary: No focal liver abnormality is seen. Status post cholecystectomy. No biliary dilatation. Pancreas: No abnormality. Spleen: No abnormality. Adrenals/Urinary Tract: No abnormality. Stomach/Bowel: No dilatation or wall thickening including the appendix. Moderate retained stool ascending and transverse colon. Descending and sigmoid diverticulosis without evidence of acute diverticulitis. Lymphatic: No adenopathy. Reproductive: Uterus and bilateral adnexa are unremarkable. Other: None. Musculoskeletal: Lumbar levoscoliosis and degenerative changes. Osteopenia. No acute or other significant osseous findings. Review of the MIP images confirms the above findings. IMPRESSION: 1. No acute CT or CTA findings in the chest, abdomen or pelvis. 2. Slightly tortuous aorta without appreciable plaques. No aneurysm, stenosis or dissection. 3. Trace single-vessel calcifications proximal LAD coronary artery. 4. Constipation and diverticulosis. 5. Osteopenia, kyphoscoliosis and degenerative change. Electronically Signed   By: Francis Quam M.D.   On: 08/01/2024 06:11   US  PELVIC COMPLETE W TRANSVAGINAL AND TORSION R/O Result Date: 08/01/2024 CLINICAL DATA:  [id number]. 73 year old female with pelvic pain left lower quadrant x2 days. EXAM: TRANSABDOMINAL AND TRANSVAGINAL ULTRASOUND OF PELVIS DOPPLER ULTRASOUND OF OVARIES TECHNIQUE: Both transabdominal and transvaginal ultrasound examinations of the pelvis were performed. Transabdominal technique was performed for global imaging of the pelvis including uterus, ovaries, adnexal regions, and pelvic cul-de-sac. It was necessary to proceed with endovaginal exam  following the transabdominal exam to visualize the adnexal areas and endometrium better. Color and duplex Doppler ultrasound was utilized to evaluate blood flow to the ovaries. COMPARISON:  Last pelvic ultrasound was 09/09/2018, last CT was 2 days ago 07/30/2024 with contrast. FINDINGS: Uterus Measurements: Anteverted measuring 4.5 x 2.3 x 4.2 cm = volume: 22.7 mL. No fibroids or other mass visualized. Unremarkable cervix apart from subcentimeter simple nabothian cysts. Endometrium Thickness: 2.7 mm, within normal limits for atrophic endometrium. No focal abnormality visualized along the endometrial stripe, but there is a small volume of nonspecific anechoic fluid within the uterine cavity. There was a small amount of fluid previously but the amount is increased since 2019. Right ovary Not seen, but was unremarkable on the CT 2 days ago. Left ovary Measurements: 1.2 x 0.7 x 1.1 cm = volume: 0.5 mL. Normal appearance/no adnexal mass. This ovary was only visualized transvaginally Pulsed Doppler evaluation of the left ovary demonstrates normal low-resistance arterial and venous waveforms. Other findings No abnormal free fluid. IMPRESSION: 1. Small volume of nonspecific anechoic fluid within the uterine cavity. There was a small amount of fluid previously but the amount is increased since 2019. 2. Normal thickness of the atrophic endometrium. 3. Negative left ovary. 4. Nonvisualization of the right ovary, but it was unremarkable on the CT 2 days ago. Electronically Signed   By: Francis Quam M.D.   On: 08/01/2024 05:47   CT ABDOMEN PELVIS W CONTRAST Result Date: 07/30/2024 CLINICAL DATA:  Left lower back pain radiating to right abdominal area. EXAM: CT ABDOMEN AND PELVIS WITH CONTRAST TECHNIQUE: Multidetector CT imaging of the abdomen and pelvis was performed using the standard protocol following bolus administration of intravenous contrast. RADIATION DOSE REDUCTION: This exam was performed according to  the  departmental dose-optimization program which includes automated exposure control, adjustment of the mA and/or kV according to patient size and/or use of iterative reconstruction technique. CONTRAST:  OMNIPAQUE  IOHEXOL  300 MG/ML  SOLN COMPARISON:  09/09/2018. FINDINGS: Lower chest: No acute findings. Heart size normal. No pericardial effusion. No pleural effusion. Distal esophagus is grossly unremarkable. Hepatobiliary: Liver is unremarkable. Cholecystectomy. No biliary ductal dilatation. Pancreas: Negative. Spleen: Negative. Adrenals/Urinary Tract: Adrenal glands and kidneys are unremarkable. Ureters are decompressed. Bladder is relatively low in volume. Stomach/Bowel: Stomach, small bowel, appendix and colon are unremarkable. Incidental note is made of a duodenal diverticulum. Moderate stool burden. Vascular/Lymphatic: Vascular structures are unremarkable. No pathologically enlarged lymph nodes. Reproductive: Uterus is visualized.  No adnexal mass. Other: No free fluid.  Mesenteries and peritoneum are unremarkable. Musculoskeletal: Degenerative changes in the spine. Levoconvex scoliosis. IMPRESSION: 1. No acute findings. 2. Moderate stool burden. Electronically Signed   By: Newell Eke M.D.   On: 07/30/2024 15:35   DG Chest 2 View Result Date: 07/30/2024 CLINICAL DATA:  cough EXAM: CHEST - 2 VIEW COMPARISON:  Chest x-ray 09/07/2018 FINDINGS: The heart and mediastinal contours are within normal limits. No focal consolidation. No pulmonary edema. No pleural effusion. No pneumothorax. No acute osseous abnormality. Right shoulder rotator cuff anchor suture noted. IMPRESSION: No active cardiopulmonary disease. Electronically Signed   By: Morgane  Naveau M.D.   On: 07/30/2024 11:59     Subjective: No acute issues or events overnight   Discharge Exam: Vitals:   08/02/24 0241 08/02/24 0623  BP: 138/75 134/71  Pulse: 99 98  Resp:    Temp: 98.1 F (36.7 C) 98 F (36.7 C)  SpO2: 99% 98%    Vitals:   08/01/24 2023 08/01/24 2026 08/02/24 0241 08/02/24 0623  BP: (!) 161/86 (!) 144/73 138/75 134/71  Pulse: 91 89 99 98  Resp:      Temp:   98.1 F (36.7 C) 98 F (36.7 C)  TempSrc:   Oral Oral  SpO2:   99% 98%  Weight:      Height:        General: Pt is alert, awake, not in acute distress Cardiovascular: RRR, S1/S2 +, no rubs, no gallops Respiratory: CTA bilaterally, no wheezing, no rhonchi Abdominal: Soft, NT, ND, bowel sounds + Extremities: no edema, no cyanosis    The results of significant diagnostics from this hospitalization (including imaging, microbiology, ancillary and laboratory) are listed below for reference.     Microbiology: No results found for this or any previous visit (from the past 240 hours).   Labs: BNP (last 3 results) No results for input(s): BNP in the last 8760 hours. Basic Metabolic Panel: Recent Labs  Lab 07/30/24 1212 08/01/24 0416 08/01/24 0911  NA 139 134*  --   K 3.6 3.4*  --   CL 104 99  --   CO2 20* 19*  --   GLUCOSE 111* 120*  --   BUN 9 8  --   CREATININE 0.61 0.61  --   CALCIUM 9.9 10.1  --   MG  --   --  2.1  PHOS  --   --  3.0   Liver Function Tests: Recent Labs  Lab 07/30/24 1212 08/01/24 0416  AST 26 30  ALT 27 32  ALKPHOS 118 128*  BILITOT 1.3* 1.2  PROT 7.3 7.6  ALBUMIN 4.4 4.8   Recent Labs  Lab 07/30/24 1212 08/01/24 0416  LIPASE 21 45   No results for input(s):  AMMONIA in the last 168 hours. CBC: Recent Labs  Lab 07/30/24 1052 08/01/24 0416  WBC 11.2* 7.7  HGB 14.4 15.1*  HCT 42.7 42.8  MCV 87.1 85.8  PLT 281 295   Cardiac Enzymes: No results for input(s): CKTOTAL, CKMB, CKMBINDEX, TROPONINI in the last 168 hours. BNP: Invalid input(s): POCBNP CBG: No results for input(s): GLUCAP in the last 168 hours. D-Dimer No results for input(s): DDIMER in the last 72 hours.  Hgb A1c No results for input(s): HGBA1C in the last 72 hours. Lipid Profile No results  for input(s): CHOL, HDL, LDLCALC, TRIG, CHOLHDL, LDLDIRECT in the last 72 hours. Thyroid function studies Recent Labs    08/01/24 1225  TSH 4.600*   Anemia work up No results for input(s): VITAMINB12, FOLATE, FERRITIN, TIBC, IRON, RETICCTPCT in the last 72 hours. Urinalysis    Component Value Date/Time   COLORURINE YELLOW 08/01/2024 0503   APPEARANCEUR CLEAR 08/01/2024 0503   LABSPEC 1.004 (L) 08/01/2024 0503   PHURINE 8.0 08/01/2024 0503   GLUCOSEU NEGATIVE 08/01/2024 0503   HGBUR NEGATIVE 08/01/2024 0503   BILIRUBINUR NEGATIVE 08/01/2024 0503   KETONESUR 5 (A) 08/01/2024 0503   PROTEINUR NEGATIVE 08/01/2024 0503   UROBILINOGEN 0.2 11/15/2013 0818   NITRITE NEGATIVE 08/01/2024 0503   LEUKOCYTESUR NEGATIVE 08/01/2024 0503   Sepsis Labs Recent Labs  Lab 07/30/24 1052 08/01/24 0416  WBC 11.2* 7.7   Microbiology No results found for this or any previous visit (from the past 240 hours).   Time coordinating discharge: Over 30 minutes  SIGNED:   Elsie JAYSON [place], DO [facility] 08/02/2024, 2:07 PM Pager   If 7PM-7AM, please contact night-coverage [email or web address]

## 2024-08-02 NOTE — Plan of Care (Signed)
   Problem: Clinical Measurements: Goal: Will remain free from infection Outcome: Progressing Goal: Diagnostic test results will improve Outcome: Progressing

## 2024-08-02 NOTE — Plan of Care (Signed)

## 2024-08-02 NOTE — Progress Notes (Signed)
 Reviewed written d/c instructions w pt and all questions answered. She verbalized understanding. D/C via w/c w all belongings in stable condition.

## 2024-08-04 ENCOUNTER — Ambulatory Visit: Payer: PRIVATE HEALTH INSURANCE
